# Patient Record
Sex: Male | Born: 1990 | Race: Black or African American | Hispanic: No | Marital: Single | Smoking: Current every day smoker
Health system: Southern US, Community
[De-identification: ages and names within clinical notes are randomized; demographics above are authoritative.]

## PROBLEM LIST (undated history)

## (undated) DIAGNOSIS — J189 Pneumonia, unspecified organism: Secondary | ICD-10-CM

## (undated) DIAGNOSIS — N200 Calculus of kidney: Secondary | ICD-10-CM

## (undated) DIAGNOSIS — I2699 Other pulmonary embolism without acute cor pulmonale: Secondary | ICD-10-CM

## (undated) HISTORY — DX: Other pulmonary embolism without acute cor pulmonale: I26.99

---
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
## 2012-03-13 ENCOUNTER — Emergency Department (HOSPITAL_COMMUNITY)
Admission: EM | Admit: 2012-03-13 | Discharge: 2012-03-13 | Disposition: A | Discharge status: Home / Self Care | Payer: No Typology Code available for payment source | Attending: Emergency Medicine | Admitting: Emergency Medicine

## 2012-03-13 ENCOUNTER — Encounter (HOSPITAL_COMMUNITY): Payer: Self-pay | Admitting: Family Medicine

## 2012-03-13 ENCOUNTER — Emergency Department (HOSPITAL_COMMUNITY): Payer: No Typology Code available for payment source

## 2012-03-13 DIAGNOSIS — Y998 Other external cause status: Secondary | ICD-10-CM | POA: Insufficient documentation

## 2012-03-13 DIAGNOSIS — F17200 Nicotine dependence, unspecified, uncomplicated: Secondary | ICD-10-CM | POA: Insufficient documentation

## 2012-03-13 DIAGNOSIS — Y93I9 Activity, other involving external motion: Secondary | ICD-10-CM | POA: Insufficient documentation

## 2012-03-13 DIAGNOSIS — S335XXA Sprain of ligaments of lumbar spine, initial encounter: Secondary | ICD-10-CM | POA: Insufficient documentation

## 2012-03-13 DIAGNOSIS — S39012A Strain of muscle, fascia and tendon of lower back, initial encounter: Secondary | ICD-10-CM

## 2012-03-13 LAB — URINALYSIS, ROUTINE W REFLEX MICROSCOPIC
Ketones, ur: NEGATIVE mg/dL
Nitrite: NEGATIVE
Specific Gravity, Urine: 1.021 (ref 1.005–1.030)
Urobilinogen, UA: 0.2 mg/dL (ref 0.0–1.0)
pH: 6 (ref 5.0–8.0)

## 2012-03-13 LAB — URINE MICROSCOPIC-ADD ON

## 2012-03-13 MED ORDER — IBUPROFEN 400 MG PO TABS
600.0000 mg | ORAL_TABLET | Freq: Once | ORAL | Status: AC
  Administered 2012-03-13: 600 mg via ORAL
  Filled 2012-03-13: qty 1
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
## 2012-03-13 NOTE — ED Notes (Signed)
MD at bedside. 
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
## 2013-05-05 ENCOUNTER — Emergency Department (HOSPITAL_COMMUNITY)
Admission: EM | Admit: 2013-05-05 | Discharge: 2013-05-05 | Disposition: A | Discharge status: Home / Self Care | Payer: Self-pay | Attending: Emergency Medicine | Admitting: Emergency Medicine

## 2013-05-05 ENCOUNTER — Emergency Department (HOSPITAL_COMMUNITY): Payer: PRIVATE HEALTH INSURANCE

## 2013-05-05 ENCOUNTER — Encounter (HOSPITAL_COMMUNITY): Payer: Self-pay | Admitting: Emergency Medicine

## 2013-05-05 DIAGNOSIS — F17200 Nicotine dependence, unspecified, uncomplicated: Secondary | ICD-10-CM | POA: Insufficient documentation

## 2013-05-05 DIAGNOSIS — Z79899 Other long term (current) drug therapy: Secondary | ICD-10-CM | POA: Insufficient documentation

## 2013-05-05 DIAGNOSIS — M5430 Sciatica, unspecified side: Secondary | ICD-10-CM | POA: Insufficient documentation

## 2013-05-05 DIAGNOSIS — IMO0002 Reserved for concepts with insufficient information to code with codable children: Secondary | ICD-10-CM | POA: Insufficient documentation

## 2013-05-05 DIAGNOSIS — M25559 Pain in unspecified hip: Secondary | ICD-10-CM | POA: Insufficient documentation

## 2013-05-05 DIAGNOSIS — M5432 Sciatica, left side: Secondary | ICD-10-CM

## 2013-05-05 DIAGNOSIS — M545 Low back pain: Secondary | ICD-10-CM

## 2013-05-05 MED ORDER — METHOCARBAMOL 500 MG PO TABS
750.0000 mg | ORAL_TABLET | Freq: Once | ORAL | Status: AC
  Administered 2013-05-05: 750 mg via ORAL
  Filled 2013-05-05: qty 2

## 2013-05-05 MED ORDER — PREDNISONE 20 MG PO TABS: 40.0000 mg | ORAL_TABLET | Freq: Every day | ORAL | Status: DC

## 2013-05-05 MED ORDER — METHOCARBAMOL 500 MG PO TABS: 500.0000 mg | ORAL_TABLET | Freq: Two times a day (BID) | ORAL | Status: DC
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
## 2013-05-05 NOTE — ED Notes (Signed)
Pt c/o back and posterior thigh pain x 1.5 months. Works as Engineer, materials, standing for long periods.

## 2013-05-06 NOTE — ED Provider Notes (Signed)
Medical screening examination/treatment/procedure(s) were performed by non-physician practitioner and as supervising physician I was immediately available for consultation/collaboration.  EKG Interpretation   None         Audree Camel, MD 05/06/13 [phone]

## 2018-09-13 ENCOUNTER — Inpatient Hospital Stay (HOSPITAL_COMMUNITY)
Admission: EM | Admit: 2018-09-13 | Discharge: 2018-09-17 | DRG: 175 | Disposition: A | Discharge status: Home Health | Payer: PRIVATE HEALTH INSURANCE | Attending: Internal Medicine | Admitting: Internal Medicine

## 2018-09-13 ENCOUNTER — Encounter (HOSPITAL_COMMUNITY): Payer: Self-pay

## 2018-09-13 DIAGNOSIS — Z87442 Personal history of urinary calculi: Secondary | ICD-10-CM

## 2018-09-13 DIAGNOSIS — R091 Pleurisy: Secondary | ICD-10-CM

## 2018-09-13 DIAGNOSIS — R109 Unspecified abdominal pain: Secondary | ICD-10-CM | POA: Diagnosis not present

## 2018-09-13 DIAGNOSIS — I82402 Acute embolism and thrombosis of unspecified deep veins of left lower extremity: Secondary | ICD-10-CM | POA: Diagnosis present

## 2018-09-13 DIAGNOSIS — I82461 Acute embolism and thrombosis of right calf muscular vein: Secondary | ICD-10-CM | POA: Diagnosis present

## 2018-09-13 DIAGNOSIS — F17200 Nicotine dependence, unspecified, uncomplicated: Secondary | ICD-10-CM | POA: Diagnosis present

## 2018-09-13 DIAGNOSIS — J181 Lobar pneumonia, unspecified organism: Secondary | ICD-10-CM | POA: Diagnosis present

## 2018-09-13 DIAGNOSIS — I2609 Other pulmonary embolism with acute cor pulmonale: Secondary | ICD-10-CM | POA: Diagnosis not present

## 2018-09-13 DIAGNOSIS — I2699 Other pulmonary embolism without acute cor pulmonale: Secondary | ICD-10-CM | POA: Diagnosis present

## 2018-09-13 DIAGNOSIS — J189 Pneumonia, unspecified organism: Secondary | ICD-10-CM

## 2018-09-13 DIAGNOSIS — N200 Calculus of kidney: Secondary | ICD-10-CM | POA: Diagnosis present

## 2018-09-13 DIAGNOSIS — Z23 Encounter for immunization: Secondary | ICD-10-CM

## 2018-09-13 HISTORY — DX: Calculus of kidney: N20.0

## 2018-09-13 HISTORY — DX: Pneumonia, unspecified organism: J18.9

## 2018-09-13 MED ORDER — KETOROLAC TROMETHAMINE 15 MG/ML IJ SOLN
15.0000 mg | Freq: Once | INTRAMUSCULAR | Status: AC
  Administered 2018-09-14: 15 mg via INTRAVENOUS
  Filled 2018-09-13: qty 1

## 2018-09-13 MED ORDER — SODIUM CHLORIDE 0.9 % IV BOLUS
1000.0000 mL | Freq: Once | INTRAVENOUS | Status: AC
  Administered 2018-09-14: 1000 mL via INTRAVENOUS

## 2018-09-13 NOTE — ED Triage Notes (Signed)
Pt states that he just got back from [place] and was diagnosed with L sided kidney stone and pneumonia. Pain is getting worse, pt has prescription but hasn't had it filled yet.

## 2018-09-13 NOTE — ED Provider Notes (Signed)
[facility] EMERGENCY DEPARTMENT Provider Note  CSN: [id number] Arrival date & time: 09/13/18 2135  Chief Complaint(s) Nephrolithiasis  HPI Johnny Ramsey is a 28 y.o. male with no pertinent past medical history who presents to the emergency department with left-sided flank pain that began several days ago.  Patient is a [employer] and just drove back from [place].  Patient reports that he was seen in a small emergency department in [place] and diagnosed with left renal stone seen on CT scan.  Incidentally he was told that they also saw evidence of pneumonia in the left lower lobe.  He was given prescription for Levaquin which he is has not filled.  Flank pain has been fluctuating in nature but gradually worsened since onset.  Now severe.  Initially improved with Motrin and Aleve but not anymore.  Pain is exacerbated with deep breathing.  He denies any recent fevers or infections.  No coughing or congestion.  No shortness of breath.  He does endorse pleuritic chest pain.  No lower extremity pain or swelling.  Denies any prior blood clots, history of autoimmune disorder, cancer.  Denies any drug use.  Endorses tobacco use.  He denies any nausea or vomiting.  No diarrhea.  He does report change in the urine color.  HPI  Past Medical History Past Medical History:  Diagnosis Date  . Kidney stone   . Pneumonia    There are no active problems to display for this patient.  Home Medication(s) Prior to Admission medications   Medication Sig Start Date End Date Taking? Authorizing Provider  methocarbamol (ROBAXIN) 500 MG tablet Take 1 tablet (500 mg total) by mouth 2 (two) times daily. Patient not taking: Reported on 09/13/2018 05/05/13   Muthersbaugh, Dahlia Client, PA-C  predniSONE (DELTASONE) 20 MG tablet Take 2 tablets (40 mg total) by mouth daily. Patient not taking: Reported on 09/13/2018 05/05/13   Muthersbaugh, Boyd Kerbs                                                                                   Past Surgical History History reviewed. No pertinent surgical history. Family History No family history on file.  Social History Social History   Tobacco Use  . Smoking status: Current Every Day Smoker  Substance Use Topics  . Alcohol use: No  . Drug use: Not on file   Allergies Patient has no known allergies.  Review of Systems Review of Systems All other systems are reviewed and are negative for acute change except as noted in the HPI  Physical Exam Vital Signs  I have reviewed the triage vital signs BP 128/85   Pulse (!) 104   Temp 100.1 F (37.8 C) (Oral)   Resp 16   SpO2 96%   Physical Exam Vitals signs reviewed.  Constitutional:      General: He is not in acute distress.    Appearance: He is well-developed. He is not diaphoretic.  HENT:     Head: Normocephalic and atraumatic.     Nose: Nose normal.  Eyes:     General: No scleral icterus.       Right eye: No discharge.  Left eye: No discharge.     Conjunctiva/sclera: Conjunctivae normal.     Pupils: Pupils are equal, round, and reactive to light.  Neck:     Musculoskeletal: Normal range of motion and neck supple.  Cardiovascular:     Rate and Rhythm: Normal rate and regular rhythm.     Heart sounds: No murmur. No friction rub. No gallop.   Pulmonary:     Effort: Pulmonary effort is normal. No respiratory distress.     Breath sounds: Normal breath sounds. No stridor. No rales.  Abdominal:     General: There is no distension.     Palpations: Abdomen is soft.     Tenderness: There is no abdominal tenderness. There is left CVA tenderness.  Musculoskeletal:     Thoracic back: He exhibits tenderness.       Back:  Skin:    General: Skin is warm and dry.     Findings: No erythema or rash.  Neurological:     Mental Status: He is alert and oriented to person, place, and time.     ED Results and Treatments Labs (all  labs ordered are listed, but only abnormal results are displayed) Labs Reviewed  CBC WITH DIFFERENTIAL/PLATELET - Abnormal; Notable for the following components:      Result Value   WBC 10.8 (*)    MCH 25.6 (*)    All other components within normal limits  D-DIMER, QUANTITATIVE (NOT AT [facility]) - Abnormal; Notable for the following components:   D-Dimer, Quant 3.21 (*)    All other components within normal limits  URINALYSIS, ROUTINE W REFLEX MICROSCOPIC - Abnormal; Notable for the following components:   Hgb urine dipstick MODERATE (*)    Ketones, ur 5 (*)    All other components within normal limits  BASIC METABOLIC PANEL                                                                                                                         EKG  EKG Interpretation  Date/Time:  Monday September 14 2018 00:36:53 EDT Ventricular Rate:  86 PR Interval:    QRS Duration: 118 QT Interval:  362 QTC Calculation: 433 R Axis:   30 Text Interpretation:  Sinus rhythm Incomplete right bundle branch block Borderline ST elevation, anterolateral leads Baseline wander in lead(s) V1 NO STEMI. No old tracing to compare Confirmed by Drema Pry [phone]) on 09/14/2018 2:34:27 AM      Radiology Dg Chest 2 View  Result Date: 09/14/2018 CLINICAL DATA:  Left-sided kidney stone and pneumonia diagnosis. Recent return from [place]. EXAM: CHEST - 2 VIEW COMPARISON:  None. FINDINGS: Shallow inspiration. Infiltration or atelectasis in both lung bases, greater on the left. Blunting of the left costophrenic angle consistent with fluid or pleural thickening. No pneumothorax. Heart size and pulmonary vascularity are normal. Mediastinal contours appear intact. IMPRESSION: Shallow inspiration with infiltration or atelectasis in both lung bases. Fluid or thickened pleura in the left costophrenic angle. Electronically  Signed   By: Burman Nieves M.D.   On: 09/14/2018 00:33   Ct Angio Chest Pe W And/or Wo Contrast  Result  Date: 09/14/2018 CLINICAL DATA:  Shortness of breath, chest and flank pain. Positive D-dimer. History of kidney stones. EXAM: CT ANGIOGRAPHY CHEST CT ABDOMEN AND PELVIS WITH CONTRAST TECHNIQUE: Multidetector CT imaging of the chest was performed using the standard protocol during bolus administration of intravenous contrast. Multiplanar CT image reconstructions and MIPs were obtained to evaluate the vascular anatomy. Multidetector CT imaging of the abdomen and pelvis was performed using the standard protocol during bolus administration of intravenous contrast. CONTRAST:  <See Chart> ISOVUE-370 IOPAMIDOL (ISOVUE-370) INJECTION 76% COMPARISON:  None. FINDINGS: CTA CHEST FINDINGS CARDIOVASCULAR: Adequate contrast opacification of the pulmonary artery's. Main pulmonary artery is not enlarged. Nonocclusive bilateral lower lobe segmental to subsegmental pulmonary emboli. Heart size is normal, RIGHT heart strain (RV/LV equals 1). No pericardial effusion. Thoracic aorta is normal course and caliber, unremarkable. MEDIASTINUM/NODES: No lymphadenopathy by CT size criteria. LUNGS/PLEURA: Tracheobronchial tree is patent, no pneumothorax. Patchy consolidation LEFT > RIGHT lung base with small LEFT pleural effusion. MUSCULOSKELETAL: Visualized soft tissues and included osseous structures appear normal. Review of the MIP images confirms the above findings. CT ABDOMEN and PELVIS FINDINGS HEPATOBILIARY: Liver and gallbladder are normal. PANCREAS: Normal. SPLEEN: Normal. ADRENALS/URINARY TRACT: Kidneys are orthotopic, demonstrating symmetric enhancement. No nephrolithiasis, hydronephrosis or solid renal masses. Early excretion of contrast decreases sensitivity for small nonobstructing calculi. The unopacified ureters are normal in course and caliber. Urinary bladder is partially distended and unremarkable. Normal adrenal glands. STOMACH/BOWEL: The stomach, small and large bowel are normal in course and caliber without inflammatory  changes. Normal appendix. VASCULAR/LYMPHATIC: Aortoiliac vessels are normal in course and caliber. No lymphadenopathy by CT size criteria. REPRODUCTIVE: Normal. OTHER: No intraperitoneal free fluid or free air. MUSCULOSKELETAL: Nonacute.  Moderate L4-5 disc osteophyte complex. IMPRESSION: CTA CHEST: 1. Acute bilateral lower lobe nonocclusive segmental to subsegmental pulmonary emboli. CT evidence of right heart strain (RV/LV Ratio = 1) consistent with at least submassive (intermediate risk) PE. The presence of right heart strain has been associated with an increased risk of morbidity and mortality. Please activate Code PE by paging [phone]. 2. LEFT lower lobe pneumonia, probable RIGHT lower lobe pneumonia. Bibasilar atelectasis. Small LEFT pleural effusion. CT ABDOMEN AND PELVIS: 1. No acute intra-abdominal/pelvic disease. Critical Value/emergent results were called by telephone at the time of interpretation on 09/14/2018 at 4:06 am to Dr. Drema Pry , who verbally acknowledged these results. Electronically Signed   By: Awilda Metro M.D.   On: 09/14/2018 04:07   Ct Abdomen Pelvis W Contrast  Result Date: 09/14/2018 CLINICAL DATA:  Shortness of breath, chest and flank pain. Positive D-dimer. History of kidney stones. EXAM: CT ANGIOGRAPHY CHEST CT ABDOMEN AND PELVIS WITH CONTRAST TECHNIQUE: Multidetector CT imaging of the chest was performed using the standard protocol during bolus administration of intravenous contrast. Multiplanar CT image reconstructions and MIPs were obtained to evaluate the vascular anatomy. Multidetector CT imaging of the abdomen and pelvis was performed using the standard protocol during bolus administration of intravenous contrast. CONTRAST:  <See Chart> ISOVUE-370 IOPAMIDOL (ISOVUE-370) INJECTION 76% COMPARISON:  None. FINDINGS: CTA CHEST FINDINGS CARDIOVASCULAR: Adequate contrast opacification of the pulmonary artery's. Main pulmonary artery is not enlarged. Nonocclusive  bilateral lower lobe segmental to subsegmental pulmonary emboli. Heart size is normal, RIGHT heart strain (RV/LV equals 1). No pericardial effusion. Thoracic aorta is normal course and caliber, unremarkable. MEDIASTINUM/NODES: No lymphadenopathy by CT size  criteria. LUNGS/PLEURA: Tracheobronchial tree is patent, no pneumothorax. Patchy consolidation LEFT > RIGHT lung base with small LEFT pleural effusion. MUSCULOSKELETAL: Visualized soft tissues and included osseous structures appear normal. Review of the MIP images confirms the above findings. CT ABDOMEN and PELVIS FINDINGS HEPATOBILIARY: Liver and gallbladder are normal. PANCREAS: Normal. SPLEEN: Normal. ADRENALS/URINARY TRACT: Kidneys are orthotopic, demonstrating symmetric enhancement. No nephrolithiasis, hydronephrosis or solid renal masses. Early excretion of contrast decreases sensitivity for small nonobstructing calculi. The unopacified ureters are normal in course and caliber. Urinary bladder is partially distended and unremarkable. Normal adrenal glands. STOMACH/BOWEL: The stomach, small and large bowel are normal in course and caliber without inflammatory changes. Normal appendix. VASCULAR/LYMPHATIC: Aortoiliac vessels are normal in course and caliber. No lymphadenopathy by CT size criteria. REPRODUCTIVE: Normal. OTHER: No intraperitoneal free fluid or free air. MUSCULOSKELETAL: Nonacute.  Moderate L4-5 disc osteophyte complex. IMPRESSION: CTA CHEST: 1. Acute bilateral lower lobe nonocclusive segmental to subsegmental pulmonary emboli. CT evidence of right heart strain (RV/LV Ratio = 1) consistent with at least submassive (intermediate risk) PE. The presence of right heart strain has been associated with an increased risk of morbidity and mortality. Please activate Code PE by paging [phone]. 2. LEFT lower lobe pneumonia, probable RIGHT lower lobe pneumonia. Bibasilar atelectasis. Small LEFT pleural effusion. CT ABDOMEN AND PELVIS: 1. No acute  intra-abdominal/pelvic disease. Critical Value/emergent results were called by telephone at the time of interpretation on 09/14/2018 at 4:06 am to Dr. Drema Pry , who verbally acknowledged these results. Electronically Signed   By: Awilda Metro M.D.   On: 09/14/2018 04:07   Pertinent labs & imaging results that were available during my care of the patient were reviewed by me and considered in my medical decision making (see chart for details).  Medications Ordered in ED Medications  cefTRIAXone (ROCEPHIN) 1 g in sodium chloride 0.9 % 100 mL IVPB (has no administration in time range)  azithromycin (ZITHROMAX) 500 mg in sodium chloride 0.9 % 250 mL IVPB (has no administration in time range)  ketorolac (TORADOL) 15 MG/ML injection 15 mg (15 mg Intravenous Given 09/14/18 0034)  sodium chloride 0.9 % bolus 1,000 mL (1,000 mLs Intravenous New Bag/Given 09/14/18 0033)  morphine 4 MG/ML injection 4 mg (4 mg Intravenous Given 09/14/18 0109)  HYDROmorphone (DILAUDID) injection 1 mg (1 mg Intravenous Given 09/14/18 0151)  diazepam (VALIUM) injection 2.5 mg (2.5 mg Intravenous Given 09/14/18 0232)  iopamidol (ISOVUE-370) 76 % injection 100 mL (100 mLs Intravenous Contrast Given 09/14/18 0329)                                                                                                                                    Procedures Procedures CRITICAL CARE Performed by: Amadeo Garnet Fernanda Twaddell Total critical care time: 55 minutes Critical care time was exclusive of separately billable procedures and treating other patients. Critical care was necessary to treat or prevent imminent or life-threatening deterioration. Critical  care was time spent personally by me on the following activities: development of treatment plan with patient and/or surrogate as well as nursing, discussions with consultants, evaluation of patient's response to treatment, examination of patient, obtaining history from patient or surrogate,  ordering and performing treatments and interventions, ordering and review of laboratory studies, ordering and review of radiographic studies, pulse oximetry and re-evaluation of patient's condition.   (including critical care time)  Medical Decision Making / ED Course I have reviewed the nursing notes for this encounter and the patient's prior records (if available in EHR or on provided paperwork).    Patient presents with left flank pain.  Noted to have low-grade fever.  Denies any respiratory symptoms, but diagnosed with pneumonia on CT scan earlier this week.  Has not taken his antibiotics yet.  Also diagnosed with left renal stone seen on CT scan.  I have concern for possible PE given her history of long distance driving.  Dimer obtained and positive.  CT a notable for bilateral segmental and subsegmental nonobstructing PEs with evidence of right heart strain.  Also showing evidence of pneumonia.  UA with hematuria without evidence of infection.  CT abdomen also obtained and ruled out psoas abscess.  No intra-abdominal inflammatory/infectious process.  We will treat pneumonia with Rocephin and Cipro.  Start patient on IV heparin.  Will admit to medicine for further evaluation with right heart strain.  Final Clinical Impression(s) / ED Diagnoses Final diagnoses:  Pleurisy  Other acute pulmonary embolism with acute cor pulmonale (HCC)  Community acquired pneumonia of left lower lobe of lung (HCC)      This chart was dictated using voice recognition software.  Despite best efforts to proofread,  errors can occur which can change the documentation meaning.   Nira Conn, MD 09/14/18 [phone]

## 2018-09-14 ENCOUNTER — Emergency Department (HOSPITAL_COMMUNITY): Payer: PRIVATE HEALTH INSURANCE

## 2018-09-14 ENCOUNTER — Other Ambulatory Visit: Payer: Self-pay

## 2018-09-14 ENCOUNTER — Inpatient Hospital Stay (HOSPITAL_COMMUNITY): Payer: PRIVATE HEALTH INSURANCE

## 2018-09-14 ENCOUNTER — Encounter (HOSPITAL_COMMUNITY): Payer: Self-pay

## 2018-09-14 DIAGNOSIS — I82401 Acute embolism and thrombosis of unspecified deep veins of right lower extremity: Secondary | ICD-10-CM | POA: Diagnosis not present

## 2018-09-14 DIAGNOSIS — J181 Lobar pneumonia, unspecified organism: Secondary | ICD-10-CM | POA: Diagnosis not present

## 2018-09-14 DIAGNOSIS — R0781 Pleurodynia: Secondary | ICD-10-CM | POA: Diagnosis not present

## 2018-09-14 DIAGNOSIS — I82461 Acute embolism and thrombosis of right calf muscular vein: Secondary | ICD-10-CM | POA: Diagnosis present

## 2018-09-14 DIAGNOSIS — I82402 Acute embolism and thrombosis of unspecified deep veins of left lower extremity: Secondary | ICD-10-CM | POA: Diagnosis present

## 2018-09-14 DIAGNOSIS — N200 Calculus of kidney: Secondary | ICD-10-CM | POA: Diagnosis present

## 2018-09-14 DIAGNOSIS — I2699 Other pulmonary embolism without acute cor pulmonale: Secondary | ICD-10-CM

## 2018-09-14 DIAGNOSIS — F17200 Nicotine dependence, unspecified, uncomplicated: Secondary | ICD-10-CM | POA: Diagnosis present

## 2018-09-14 DIAGNOSIS — I2609 Other pulmonary embolism with acute cor pulmonale: Secondary | ICD-10-CM | POA: Diagnosis present

## 2018-09-14 DIAGNOSIS — Z23 Encounter for immunization: Secondary | ICD-10-CM | POA: Diagnosis not present

## 2018-09-14 DIAGNOSIS — Z87442 Personal history of urinary calculi: Secondary | ICD-10-CM | POA: Diagnosis not present

## 2018-09-14 DIAGNOSIS — R109 Unspecified abdominal pain: Secondary | ICD-10-CM | POA: Diagnosis present

## 2018-09-14 DIAGNOSIS — J189 Pneumonia, unspecified organism: Secondary | ICD-10-CM | POA: Diagnosis present

## 2018-09-14 LAB — CBC WITH DIFFERENTIAL/PLATELET
Abs Immature Granulocytes: 0.03 10*3/uL (ref 0.00–0.07)
BASOS ABS: 0 10*3/uL (ref 0.0–0.1)
Basophils Relative: 0 %
Eosinophils Absolute: 0.1 10*3/uL (ref 0.0–0.5)
Eosinophils Relative: 1 %
HEMATOCRIT: 42.8 % (ref 39.0–52.0)
HEMOGLOBIN: 13.3 g/dL (ref 13.0–17.0)
IMMATURE GRANULOCYTES: 0 %
LYMPHS ABS: 2.4 10*3/uL (ref 0.7–4.0)
LYMPHS PCT: 23 %
MCH: 25.6 pg — ABNORMAL LOW (ref 26.0–34.0)
MCHC: 31.1 g/dL (ref 30.0–36.0)
MCV: 82.5 fL (ref 80.0–100.0)
Monocytes Absolute: 1 10*3/uL (ref 0.1–1.0)
Monocytes Relative: 10 %
NEUTROS ABS: 7.1 10*3/uL (ref 1.7–7.7)
NEUTROS PCT: 66 %
NRBC: 0 % (ref 0.0–0.2)
Platelets: 278 10*3/uL (ref 150–400)
RBC: 5.19 MIL/uL (ref 4.22–5.81)
RDW: 13.4 % (ref 11.5–15.5)
WBC: 10.8 10*3/uL — ABNORMAL HIGH (ref 4.0–10.5)

## 2018-09-14 LAB — BASIC METABOLIC PANEL
ANION GAP: 11 (ref 5–15)
BUN: 10 mg/dL (ref 6–20)
CALCIUM: 9.6 mg/dL (ref 8.9–10.3)
CHLORIDE: 102 mmol/L (ref 98–111)
CO2: 25 mmol/L (ref 22–32)
Creatinine, Ser: 1.16 mg/dL (ref 0.61–1.24)
GFR calc non Af Amer: >60 mL/min (ref >60)
Glucose, Bld: 94 mg/dL (ref 70–99)
Potassium: 3.9 mmol/L (ref 3.5–5.1)
SODIUM: 138 mmol/L (ref 135–145)

## 2018-09-14 LAB — URINALYSIS, ROUTINE W REFLEX MICROSCOPIC
Bacteria, UA: NONE SEEN
Bilirubin Urine: NEGATIVE
GLUCOSE, UA: NEGATIVE mg/dL
Ketones, ur: 5 mg/dL — AB
Leukocytes,Ua: NEGATIVE
NITRITE: NEGATIVE
PROTEIN: NEGATIVE mg/dL
Specific Gravity, Urine: 1.024 (ref 1.005–1.030)
pH: 5 (ref 5.0–8.0)

## 2018-09-14 LAB — HIV ANTIBODY (ROUTINE TESTING W REFLEX): HIV Screen 4th Generation wRfx: NONREACTIVE

## 2018-09-14 LAB — ECHOCARDIOGRAM COMPLETE
Height: 72 in
Weight: 3760 oz

## 2018-09-14 LAB — STREP PNEUMONIAE URINARY ANTIGEN: Strep Pneumo Urinary Antigen: NEGATIVE

## 2018-09-14 LAB — INFLUENZA PANEL BY PCR (TYPE A & B)
Influenza A By PCR: NEGATIVE
Influenza B By PCR: NEGATIVE

## 2018-09-14 LAB — HEPARIN LEVEL (UNFRACTIONATED)
Heparin Unfractionated: 0.33 IU/mL (ref 0.30–0.70)
Heparin Unfractionated: 0.31 IU/mL (ref 0.30–0.70)

## 2018-09-14 LAB — D-DIMER, QUANTITATIVE (NOT AT ARMC): D DIMER QUANT: 3.21 ug{FEU}/mL — AB (ref 0.00–0.50)

## 2018-09-14 MED ORDER — ACETAMINOPHEN 325 MG PO TABS
650.0000 mg | ORAL_TABLET | Freq: Four times a day (QID) | ORAL | Status: DC | PRN
  Administered 2018-09-14: 650 mg via ORAL
  Filled 2018-09-14: qty 2

## 2018-09-14 MED ORDER — MORPHINE SULFATE (PF) 4 MG/ML IV SOLN
4.0000 mg | Freq: Once | INTRAVENOUS | Status: AC
  Administered 2018-09-14: 4 mg via INTRAVENOUS
  Filled 2018-09-14: qty 1

## 2018-09-14 MED ORDER — SODIUM CHLORIDE 0.9 % IV SOLN
500.0000 mg | Freq: Once | INTRAVENOUS | Status: AC
  Administered 2018-09-14: 500 mg via INTRAVENOUS
  Filled 2018-09-14: qty 500

## 2018-09-14 MED ORDER — DIAZEPAM 5 MG/ML IJ SOLN
2.5000 mg | Freq: Once | INTRAMUSCULAR | Status: AC
  Administered 2018-09-14: 2.5 mg via INTRAVENOUS
  Filled 2018-09-14: qty 2

## 2018-09-14 MED ORDER — SODIUM CHLORIDE 0.9 % IV SOLN
1.0000 g | Freq: Once | INTRAVENOUS | Status: AC
  Administered 2018-09-14: 1 g via INTRAVENOUS
  Filled 2018-09-14: qty 10

## 2018-09-14 MED ORDER — IOPAMIDOL (ISOVUE-370) INJECTION 76%
INTRAVENOUS | Status: AC
  Filled 2018-09-14: qty 100

## 2018-09-14 MED ORDER — HYDROMORPHONE HCL 1 MG/ML IJ SOLN
1.0000 mg | Freq: Once | INTRAMUSCULAR | Status: AC
  Administered 2018-09-14: 1 mg via INTRAVENOUS
  Filled 2018-09-14: qty 1

## 2018-09-14 MED ORDER — AZITHROMYCIN 250 MG PO TABS
500.0000 mg | ORAL_TABLET | ORAL | Status: DC
  Administered 2018-09-15 – 2018-09-17 (×3): 500 mg via ORAL
  Filled 2018-09-14 (×3): qty 2

## 2018-09-14 MED ORDER — IOPAMIDOL (ISOVUE-370) INJECTION 76%
100.0000 mL | Freq: Once | INTRAVENOUS | Status: AC | PRN
  Administered 2018-09-14: 100 mL via INTRAVENOUS

## 2018-09-14 MED ORDER — HEPARIN BOLUS VIA INFUSION
6000.0000 [IU] | Freq: Once | INTRAVENOUS | Status: AC
  Administered 2018-09-14: 6000 [IU] via INTRAVENOUS
  Filled 2018-09-14: qty 6000

## 2018-09-14 MED ORDER — PNEUMOCOCCAL VAC POLYVALENT 25 MCG/0.5ML IJ INJ
0.5000 mL | INJECTION | INTRAMUSCULAR | Status: AC
  Administered 2018-09-16: 0.5 mL via INTRAMUSCULAR
  Filled 2018-09-14: qty 0.5

## 2018-09-14 MED ORDER — SODIUM CHLORIDE 0.9 % IV SOLN
1.0000 g | INTRAVENOUS | Status: DC
  Administered 2018-09-15 – 2018-09-17 (×3): 1 g via INTRAVENOUS
  Filled 2018-09-14 (×3): qty 10

## 2018-09-14 MED ORDER — HEPARIN (PORCINE) 25000 UT/250ML-% IV SOLN
2000.0000 [IU]/h | INTRAVENOUS | Status: DC
  Administered 2018-09-14: 1800 [IU]/h via INTRAVENOUS
  Administered 2018-09-15 – 2018-09-17 (×5): 2000 [IU]/h via INTRAVENOUS
  Filled 2018-09-14 (×7): qty 250

## 2018-09-14 MED ORDER — TRAMADOL HCL 50 MG PO TABS
50.0000 mg | ORAL_TABLET | Freq: Four times a day (QID) | ORAL | Status: DC | PRN
  Administered 2018-09-14 – 2018-09-16 (×3): 50 mg via ORAL
  Filled 2018-09-14 (×3): qty 1

## 2018-09-14 NOTE — ED Notes (Signed)
Pt returned from vascular

## 2018-09-14 NOTE — Progress Notes (Signed)
  Echocardiogram 2D Echocardiogram has been performed.  Johnny Ramsey L Androw 09/14/2018, 10:55 AM 

## 2018-09-14 NOTE — ED Notes (Signed)
Meal tray at bedside.  

## 2018-09-14 NOTE — Progress Notes (Signed)
Patient complaining of 4/10 intermittent, throbbing left lower flank pain.  Patient tried Tylenol earlier and was not really effective.  RN text paged [facility] with this information.

## 2018-09-14 NOTE — H&P (Signed)
History and Physical    Johnny Ramsey Hunn ZOX:[id number] DOB: 11/06/1990 DOA: 09/13/2018  PCP: Patient, No Pcp Per  Patient coming from: Home  I have personally briefly reviewed [facility]  Chief Complaint: Flank pain  HPI: Johnny Ramsey Myron is Ramsey 28 y.o. male with medical history significant of previously healthy.  Patient is Ramsey Naval architecttruck driver.  Just drove back from [place].  Was seen in small ED in [place] for L flank pain.  Diagnosed with small renal stone, and told that they incidentally saw evidence of LLL PNA on the CT scan.  Given script for levoquin which he didn't fill.  Flank pain has been fluctuating in nature, but gradually worsened since onset.  He continues to have no cough, no congestion, no SOB.  Pain is worse with deep breathing.  No PMH of blood clots, no lower extremity swelling or pain.   ED Course: D. Dimer was elevated at 3.2.  UA shows mod HGB, neg LE, nitrites, or WBCs.  Tm 100.9.  HR 109. WBC 10.8k  CTA chest reveals B PEs with R heart strain.  LLL PNA, and probable RLL PNA.  Started on heparin gtt, rocephin, and azithro.   Review of Systems: As per HPI otherwise 10 point review of systems negative.   Past Medical History:  Diagnosis Date  . Kidney stone   . Pneumonia     History reviewed. No pertinent surgical history.   reports that he has been smoking. He does not have any smokeless tobacco history on file. He reports that he does not drink alcohol. No history on file for drug.  No Known Allergies  No family history on file. No sick contacts in family  Prior to Admission medications   Medication Sig Start Date End Date Taking? Authorizing Provider  methocarbamol (ROBAXIN) 500 MG tablet Take 1 tablet (500 mg total) by mouth 2 (two) times daily. Patient not taking: Reported on 09/13/2018 05/05/13   Muthersbaugh, Dahlia ClientHannah, PA-C  predniSONE (DELTASONE) 20 MG tablet Take 2 tablets (40 mg total) by mouth  daily. Patient not taking: Reported on 09/13/2018 05/05/13   Muthersbaugh, Dahlia ClientHannah, PA-C    Physical Exam: Vitals:   09/14/18 0111 09/14/18 0130 09/14/18 0415 09/14/18 0438  BP:  133/86  111/68  Pulse:  82 91 78  Resp:  (!) 26 (!) 23 16  Temp:      TempSrc:      SpO2:  100% 93% 94%  Weight: 106.6 kg     Height: 6' (1.829 m)       Constitutional: NAD, calm, comfortable Eyes: PERRL, lids and conjunctivae normal ENMT: Mucous membranes are moist. Posterior pharynx clear of any exudate or lesions.Normal dentition.  Neck: normal, supple, no masses, no thyromegaly Respiratory: clear to auscultation bilaterally, no wheezing, no crackles. Normal respiratory effort. No accessory muscle use.  Cardiovascular: Regular rate and rhythm, no murmurs / rubs / gallops. No extremity edema. 2+ pedal pulses. No carotid bruits.  Abdomen: no tenderness, no masses palpated. No hepatosplenomegaly. Bowel sounds positive.  Musculoskeletal: no clubbing / cyanosis. No joint deformity upper and lower extremities. Good ROM, no contractures. Normal muscle tone.  Skin: no rashes, lesions, ulcers. No induration Neurologic: CN 2-12 grossly intact. Sensation intact, DTR normal. Strength 5/5 in all 4.  Psychiatric: Normal judgment and insight. Alert and oriented x 3. Normal mood.    Labs on Admission: I have personally reviewed following labs and imaging studies  CBC: Recent Labs  Lab 09/14/18 0002  WBC 10.8*  NEUTROABS 7.1  HGB 13.3  HCT 42.8  MCV 82.5  PLT 278   Basic Metabolic Panel: Recent Labs  Lab 09/14/18 0002  NA 138  K 3.9  CL 102  CO2 25  GLUCOSE 94  BUN 10  CREATININE 1.16  CALCIUM 9.6   GFR: Estimated Creatinine Clearance: 121.8 mL/min (by C-G formula based on SCr of 1.16 mg/dL). Liver Function Tests: No results for input(s): AST, ALT, ALKPHOS, BILITOT, PROT, ALBUMIN in the last 168 hours. No results for input(s): LIPASE, AMYLASE in the last 168 hours. No results for input(s): AMMONIA  in the last 168 hours. Coagulation Profile: No results for input(s): INR, PROTIME in the last 168 hours. Cardiac Enzymes: No results for input(s): CKTOTAL, CKMB, CKMBINDEX, TROPONINI in the last 168 hours. BNP (last 3 results) No results for input(s): PROBNP in the last 8760 hours. HbA1C: No results for input(s): HGBA1C in the last 72 hours. CBG: No results for input(s): GLUCAP in the last 168 hours. Lipid Profile: No results for input(s): CHOL, HDL, LDLCALC, TRIG, CHOLHDL, LDLDIRECT in the last 72 hours. Thyroid Function Tests: No results for input(s): TSH, T4TOTAL, FREET4, T3FREE, THYROIDAB in the last 72 hours. Anemia Panel: No results for input(s): VITAMINB12, FOLATE, FERRITIN, TIBC, IRON, RETICCTPCT in the last 72 hours. Urine analysis:    Component Value Date/Time   COLORURINE YELLOW 09/14/2018 0034   APPEARANCEUR CLEAR 09/14/2018 0034   LABSPEC 1.024 09/14/2018 0034   PHURINE 5.0 09/14/2018 0034   GLUCOSEU NEGATIVE 09/14/2018 0034   HGBUR MODERATE ([place]) 09/14/2018 0034   BILIRUBINUR NEGATIVE 09/14/2018 0034   KETONESUR 5 ([place]) 09/14/2018 0034   PROTEINUR NEGATIVE 09/14/2018 0034   UROBILINOGEN 0.2 03/13/2012 1400   NITRITE NEGATIVE 09/14/2018 0034   LEUKOCYTESUR NEGATIVE 09/14/2018 0034    Radiological Exams on Admission: Dg Chest 2 View  Result Date: 09/14/2018 CLINICAL DATA:  Left-sided kidney stone and pneumonia diagnosis. Recent return from [place]. EXAM: CHEST - 2 VIEW COMPARISON:  None. FINDINGS: Shallow inspiration. Infiltration or atelectasis in both lung bases, greater on the left. Blunting of the left costophrenic angle consistent with fluid or pleural thickening. No pneumothorax. Heart size and pulmonary vascularity are normal. Mediastinal contours appear intact. IMPRESSION: Shallow inspiration with infiltration or atelectasis in both lung bases. Fluid or thickened pleura in the left costophrenic angle. Electronically Signed   By: Burman Nieves M.D.   On:  09/14/2018 00:33   Ct Angio Chest Pe W And/or Wo Contrast  Result Date: 09/14/2018 CLINICAL DATA:  Shortness of breath, chest and flank pain. Positive D-dimer. History of kidney stones. EXAM: CT ANGIOGRAPHY CHEST CT ABDOMEN AND PELVIS WITH CONTRAST TECHNIQUE: Multidetector CT imaging of the chest was performed using the standard protocol during bolus administration of intravenous contrast. Multiplanar CT image reconstructions and MIPs were obtained to evaluate the vascular anatomy. Multidetector CT imaging of the abdomen and pelvis was performed using the standard protocol during bolus administration of intravenous contrast. CONTRAST:  <See Chart> ISOVUE-370 IOPAMIDOL (ISOVUE-370) INJECTION 76% COMPARISON:  None. FINDINGS: CTA CHEST FINDINGS CARDIOVASCULAR: Adequate contrast opacification of the pulmonary artery's. Main pulmonary artery is not enlarged. Nonocclusive bilateral lower lobe segmental to subsegmental pulmonary emboli. Heart size is normal, RIGHT heart strain (RV/LV equals 1). No pericardial effusion. Thoracic aorta is normal course and caliber, unremarkable. MEDIASTINUM/NODES: No lymphadenopathy by CT size criteria. LUNGS/PLEURA: Tracheobronchial tree is patent, no pneumothorax. Patchy consolidation LEFT > RIGHT lung base with small LEFT pleural effusion. MUSCULOSKELETAL: Visualized  soft tissues and included osseous structures appear normal. Review of the MIP images confirms the above findings. CT ABDOMEN and PELVIS FINDINGS HEPATOBILIARY: Liver and gallbladder are normal. PANCREAS: Normal. SPLEEN: Normal. ADRENALS/URINARY TRACT: Kidneys are orthotopic, demonstrating symmetric enhancement. No nephrolithiasis, hydronephrosis or solid renal masses. Early excretion of contrast decreases sensitivity for small nonobstructing calculi. The unopacified ureters are normal in course and caliber. Urinary bladder is partially distended and unremarkable. Normal adrenal glands. STOMACH/BOWEL: The stomach, small and  large bowel are normal in course and caliber without inflammatory changes. Normal appendix. VASCULAR/LYMPHATIC: Aortoiliac vessels are normal in course and caliber. No lymphadenopathy by CT size criteria. REPRODUCTIVE: Normal. OTHER: No intraperitoneal free fluid or free air. MUSCULOSKELETAL: Nonacute.  Moderate L4-5 disc osteophyte complex. IMPRESSION: CTA CHEST: 1. Acute bilateral lower lobe nonocclusive segmental to subsegmental pulmonary emboli. CT evidence of right heart strain (RV/LV Ratio = 1) consistent with at least submassive (intermediate risk) PE. The presence of right heart strain has been associated with an increased risk of morbidity and mortality. Please activate Code PE by paging [phone]. 2. LEFT lower lobe pneumonia, probable RIGHT lower lobe pneumonia. Bibasilar atelectasis. Small LEFT pleural effusion. CT ABDOMEN AND PELVIS: 1. No acute intra-abdominal/pelvic disease. Critical Value/emergent results were called by telephone at the time of interpretation on 09/14/2018 at 4:06 am to Dr. Drema Pry , who verbally acknowledged these results. Electronically Signed   By: Awilda Metro M.D.   On: 09/14/2018 04:07   Ct Abdomen Pelvis W Contrast  Result Date: 09/14/2018 CLINICAL DATA:  Shortness of breath, chest and flank pain. Positive D-dimer. History of kidney stones. EXAM: CT ANGIOGRAPHY CHEST CT ABDOMEN AND PELVIS WITH CONTRAST TECHNIQUE: Multidetector CT imaging of the chest was performed using the standard protocol during bolus administration of intravenous contrast. Multiplanar CT image reconstructions and MIPs were obtained to evaluate the vascular anatomy. Multidetector CT imaging of the abdomen and pelvis was performed using the standard protocol during bolus administration of intravenous contrast. CONTRAST:  <See Chart> ISOVUE-370 IOPAMIDOL (ISOVUE-370) INJECTION 76% COMPARISON:  None. FINDINGS: CTA CHEST FINDINGS CARDIOVASCULAR: Adequate contrast opacification of the pulmonary  artery's. Main pulmonary artery is not enlarged. Nonocclusive bilateral lower lobe segmental to subsegmental pulmonary emboli. Heart size is normal, RIGHT heart strain (RV/LV equals 1). No pericardial effusion. Thoracic aorta is normal course and caliber, unremarkable. MEDIASTINUM/NODES: No lymphadenopathy by CT size criteria. LUNGS/PLEURA: Tracheobronchial tree is patent, no pneumothorax. Patchy consolidation LEFT > RIGHT lung base with small LEFT pleural effusion. MUSCULOSKELETAL: Visualized soft tissues and included osseous structures appear normal. Review of the MIP images confirms the above findings. CT ABDOMEN and PELVIS FINDINGS HEPATOBILIARY: Liver and gallbladder are normal. PANCREAS: Normal. SPLEEN: Normal. ADRENALS/URINARY TRACT: Kidneys are orthotopic, demonstrating symmetric enhancement. No nephrolithiasis, hydronephrosis or solid renal masses. Early excretion of contrast decreases sensitivity for small nonobstructing calculi. The unopacified ureters are normal in course and caliber. Urinary bladder is partially distended and unremarkable. Normal adrenal glands. STOMACH/BOWEL: The stomach, small and large bowel are normal in course and caliber without inflammatory changes. Normal appendix. VASCULAR/LYMPHATIC: Aortoiliac vessels are normal in course and caliber. No lymphadenopathy by CT size criteria. REPRODUCTIVE: Normal. OTHER: No intraperitoneal free fluid or free air. MUSCULOSKELETAL: Nonacute.  Moderate L4-5 disc osteophyte complex. IMPRESSION: CTA CHEST: 1. Acute bilateral lower lobe nonocclusive segmental to subsegmental pulmonary emboli. CT evidence of right heart strain (RV/LV Ratio = 1) consistent with at least submassive (intermediate risk) PE. The presence of right heart strain has been associated with an increased risk  of morbidity and mortality. Please activate Code PE by paging [phone]. 2. LEFT lower lobe pneumonia, probable RIGHT lower lobe pneumonia. Bibasilar atelectasis. Small  LEFT pleural effusion. CT ABDOMEN AND PELVIS: 1. No acute intra-abdominal/pelvic disease. Critical Value/emergent results were called by telephone at the time of interpretation on 09/14/2018 at 4:06 am to Dr. Drema Pry , who verbally acknowledged these results. Electronically Signed   By: Awilda Metro M.D.   On: 09/14/2018 04:07    EKG: Independently reviewed.  Assessment/Plan Principal Problem:   Bilateral pulmonary embolism (HCC) Active Problems:   Community acquired pneumonia of both lower lobes (HCC)    1. B PEs - 1. Provoked, he is Ramsey truck driver, prolonged sitting behind wheel 2. Heparin gtt 3. 2d echo 4. BLE [place] 5. Tele monitor 2. CAP of BLL - 1. Does have fever 2. But absent most of the other expected symptoms of PNA (no cough, no SOB, etc). 3. Will treat with rocephin and azithro 4. BCx pending 5. Sputum Cx 6. Influenza PCR (though he really doesn't have ILI). 7. Covid-19 seems unlikely (no cough, no SOB, no URI symptoms, only seems to be affecting lower lobes of lungs, etc).  DVT prophylaxis: Heparin GTT Code Status: Full Family Communication: Family at bedside Disposition Plan: Home after admit Consults called: None Admission status: Admit to inpatient  Severity of Illness: The appropriate patient status for this patient is INPATIENT. Inpatient status is judged to be reasonable and necessary in order to provide the required intensity of service to ensure the patient's safety. The patient's presenting symptoms, physical exam findings, and initial radiographic and laboratory data in the context of their chronic comorbidities is felt to place them at high risk for further clinical deterioration. Furthermore, it is not anticipated that the patient will be medically stable for discharge from the hospital within 2 midnights of admission. The following factors support the patient status of inpatient.   " The patient's presenting symptoms include Flank pain, pleuritic  pain. " The worrisome physical exam findings include Fever 100.9, mild tachycardia. " The initial radiographic and laboratory data are worrisome because of B PE and BLL PNA both.    * I certify that at the point of admission it is my clinical judgment that the patient will require inpatient hospital care spanning beyond 2 midnights from the point of admission due to high intensity of service, high risk for further deterioration and high frequency of surveillance required.*    GARDNER, JARED M. DO [facility]  How to contact the [facility] Attending or Consulting provider 7A - 7P or covering provider during after hours 7P -7A, for this patient?  1. Check the care team in [facility] and look for [facility]) attending/consulting [facility] provider listed and b) the [facility] team listed 2. Log into [email or web address]  Amion Physician Scheduling and messaging for groups and whole hospitals  On call and physician scheduling software for group practices, residents, hospitalists and other medical providers for call, clinic, rotation and shift schedules. [employer] is [facility] hospital-wide system for scheduling doctors and paging doctors on call. EasyPlot is for scientific plotting and data analysis.  [email or web address]  and use [facility] universal password to access. If you do not have the password, please contact the hospital operator.  3. Locate the [facility] provider you are looking for under [facility] and page to [facility] number that you can be directly reached. 4. If you still have difficulty reaching the provider, please page the [facility] (Director on Call) for  the Hospitalists listed on amion for assistance.  09/14/2018, 5:11 AM

## 2018-09-14 NOTE — Progress Notes (Signed)
ANTICOAGULATION CONSULT NOTE  Pharmacy Consult for heparin Indication: pulmonary embolus  No Known Allergies  Patient Measurements: Height: 6' (182.9 cm) Weight: 235 lb (106.6 kg) IBW/kg (Calculated) : 77.6 Heparin Dosing Weight: 100kg  Vital Signs: BP: 132/85 (03/09 1325) Pulse Rate: 88 (03/09 1325)  Labs: Recent Labs    09/14/18 0002 09/14/18 1329  HGB 13.3  --   HCT 42.8  --   PLT 278  --   HEPARINUNFRC  --  0.33  CREATININE 1.16  --     Estimated Creatinine Clearance: 121.8 mL/min (by C-G formula based on SCr of 1.16 mg/dL).   Medical History: Past Medical History:  Diagnosis Date  . Kidney stone   . 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  Initial heparin level therapeutic at 0.33 but near bottom of range. No bleed or issues with infusion per RN. CBC wnl.  Goal of Therapy:  Heparin level 0.3-0.7 units/ml Monitor platelets by anticoagulation protocol: Yes   Plan:  Increase heparin to 1900 units/hr to ensure stays in range for PE 6h heparin level to confirm Monitor daily heparin level and CBC, s/sx bleeding  Babs Bertin, PharmD, BCPS Please check AMION for all [facility] contact numbers Clinical Pharmacist 09/14/2018 2:24 PM

## 2018-09-14 NOTE — ED Notes (Signed)
Patient transported to CT 

## 2018-09-14 NOTE — Progress Notes (Signed)
Lower extremity venous has been completed.   Preliminary results in CV Proc.   Blanch Media 09/14/2018 10:38 AM

## 2018-09-14 NOTE — ED Notes (Signed)
RN confirmed with Charge, RN pt may come to hall with mask on.

## 2018-09-14 NOTE — ED Notes (Signed)
Heparin verified with Maggie RN 

## 2018-09-14 NOTE — Progress Notes (Signed)
PROGRESS NOTE    Johnny Ramsey  ZOX:[id number] DOB: 20-Mar-1991 DOA: 09/13/2018 PCP: Patient, No Pcp Per    Brief Narrative:  Johnny Ramsey is a 28 y.o. male with medical history significant of previously healthy, who is a Naval architect,  drove back from [place].  Was seen in small ED in [place] for L flank pain.  Diagnosed with small renal stone, and told that they incidentally saw evidence of LLL PNA on the CT scan.  Given script for levoquin which he didn't fill.  Flank pain has been fluctuating in nature, but gradually worsened since onset. He continues to have no cough, no congestion, no SOB.  Pain is worse with deep breathing.  No PMH of blood clots, no lower extremity swelling or pain.  Work-up in emergency department D. Dimer was elevated at 3.2.  UA shows mod HGB, neg LE, nitrites, or WBCs.  Tm 100.9.  HR 109. WBC 10.8k. CTA chest reveals B PEs with R heart strain.  LLL PNA, and probable RLL PNA. Started on heparin gtt, rocephin, and azithro. 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   DVT prophylaxis: Heparin drip Code Status: (Full Family Communication: Mother and brother are at bedside  disposition Plan:  Home    Procedures:  Venous Dopplers  Antimicrobials:  Rocephin 09/13/2018 Zithromax 09/13/2018  Subjective: Complaining of pain with deep breathing  Objective: Vitals:   09/14/18 1104 09/14/18 1325 09/14/18 1458 09/14/18 1459  BP: 112/75 132/85  122/68  Pulse: 77 88  98  Resp: Temp:    (!) 100.6 F (38.1 C)  TempSrc:    Oral  SpO2: 95% 96%  96%  Weight:      Height:   6' (1.829 m)     Intake/Output Summary (Last 24 hours) at 09/14/2018 1524 Last data filed at 09/14/2018 1325 Gross per 24 hour  Intake -  Output 680 ml  Net -680 ml   Filed Weights   09/14/18 0111  Weight: 106.6 kg    Examination:  General exam: Appears calm and comfortable  Respiratory system: Clear to auscultation. Respiratory effort normal. Cardiovascular system: S1 & S2 heard, RRR. No JVD, murmurs, rubs, gallops or clicks. No pedal edema. Gastrointestinal system: Abdomen is nondistended, soft and nontender. No organomegaly or masses felt. Normal bowel sounds heard. Central nervous system: Alert and oriented. No focal neurological deficits. Extremities: Symmetric 5 x 5 power. Skin: No rashes, lesions or ulcers Psychiatry: Judgement and insight appear normal. Mood & affect appropriate.     Data Reviewed: I have personally reviewed following labs and imaging studies  CBC: Recent Labs  Lab 09/14/18 0002  WBC 10.8*  NEUTROABS 7.1  HGB 13.3  HCT 42.8  MCV 82.5  PLT 278   Basic Metabolic Panel: Recent Labs  Lab 09/14/18 0002  NA 138  K 3.9  CL 102  CO2 25  GLUCOSE 94  BUN 10  CREATININE 1.16  CALCIUM 9.6   GFR: Estimated Creatinine Clearance: 121.8 mL/min (by C-G formula based on SCr of 1.16 mg/dL). Liver Function Tests: No results for input(s): AST, ALT, ALKPHOS, BILITOT, PROT, ALBUMIN in the last 168 hours. No results for input(s): LIPASE, AMYLASE in the last 168 hours. No results for input(s): AMMONIA in the last 168 hours. Coagulation Profile: No results for  input(s): INR, PROTIME in the last 168 hours. Cardiac Enzymes: No results for input(s): CKTOTAL, CKMB, CKMBINDEX, TROPONINI in the last 168 hours. BNP (last 3 results) No results for input(s): PROBNP in the last 8760 hours. HbA1C: No results for input(s): HGBA1C in the last 72 hours. CBG: No results for input(s): GLUCAP in the last 168 hours. Lipid Profile: No results for input(s): CHOL, HDL, LDLCALC, TRIG, CHOLHDL, LDLDIRECT in the last 72 hours. Thyroid Function Tests: No results for input(s): TSH, T4TOTAL, FREET4, T3FREE, THYROIDAB in the last 72 hours. Anemia Panel: No results for input(s): VITAMINB12, FOLATE, FERRITIN, TIBC, IRON, RETICCTPCT in the last 72 hours. Sepsis Labs: No results for input(s): PROCALCITON, LATICACIDVEN in the last 168 hours.  Recent Results (from the past 240 hour(s))  Culture, blood (routine x 2) Call MD if unable to obtain prior to antibiotics being given     Status: None (Preliminary result)   Collection Time: 09/14/18  5:25 AM  Result Value Ref Range Status   Specimen Description BLOOD RIGHT HAND  Final   Special Requests   Final    BOTTLES DRAWN AEROBIC AND ANAEROBIC Blood Culture results may not be optimal due to an excessive volume of blood received in culture bottles PATIENT ON FOLLOWING ZITHROMAX,ROCEPHIN Performed at [facility], [address] [place], [place] [address]    Culture PENDING  Incomplete   Report Status PENDING  Incomplete  Culture, blood (routine x 2) Call MD if unable to obtain prior to antibiotics being given     Status: None (Preliminary result)   Collection Time: 09/14/18  5:25 AM  Result Value Ref Range Status   Specimen Description BLOOD LEFT HAND  Final   Special Requests   Final    BOTTLES DRAWN AEROBIC ONLY Blood Culture adequate volume PATIENT ON FOLLOWING ZITHROMAX,ROCEPHIN Performed at [facility], [address], [place], [place] [address]    Culture PENDING  Incomplete   Report Status PENDING   Incomplete         Radiology Studies: Dg Chest 2 View  Result Date: 09/14/2018 CLINICAL DATA:  Left-sided kidney stone and pneumonia diagnosis. Recent return from [place]. EXAM: CHEST - 2 VIEW COMPARISON:  None. FINDINGS: Shallow inspiration. Infiltration or atelectasis in both lung bases, greater on the left. Blunting of the left costophrenic angle consistent with fluid or pleural thickening. No pneumothorax. Heart size and pulmonary vascularity are normal. Mediastinal contours appear intact. IMPRESSION: Shallow inspiration with infiltration or atelectasis in both lung bases. Fluid or thickened pleura in the left costophrenic angle. Electronically Signed   By: Burman Nieves M.D.   On: 09/14/2018 00:33   Ct Angio Chest Pe W And/or Wo Contrast  Result Date: 09/14/2018 CLINICAL DATA:  Shortness of breath, chest and flank pain. Positive D-dimer. History of kidney stones. EXAM: CT ANGIOGRAPHY CHEST CT ABDOMEN AND PELVIS WITH CONTRAST TECHNIQUE: Multidetector CT imaging of the chest was performed using the standard protocol during bolus administration of intravenous contrast. Multiplanar CT image reconstructions and MIPs  were obtained to evaluate the vascular anatomy. Multidetector CT imaging of the abdomen and pelvis was performed using the standard protocol during bolus administration of intravenous contrast. CONTRAST:  <See Chart> ISOVUE-370 IOPAMIDOL (ISOVUE-370) INJECTION 76% COMPARISON:  None. FINDINGS: CTA CHEST FINDINGS CARDIOVASCULAR: Adequate contrast opacification of the pulmonary artery's. Main pulmonary artery is not enlarged. Nonocclusive bilateral lower lobe segmental to subsegmental pulmonary emboli. Heart size is normal, RIGHT heart strain (RV/LV equals 1). No pericardial effusion. Thoracic aorta is normal course and caliber, unremarkable. MEDIASTINUM/NODES: No lymphadenopathy by CT size criteria. LUNGS/PLEURA: Tracheobronchial tree is patent, no pneumothorax. Patchy consolidation LEFT  > RIGHT lung base with small LEFT pleural effusion. MUSCULOSKELETAL: Visualized soft tissues and included osseous structures appear normal. Review of the MIP images confirms the above findings. CT ABDOMEN and PELVIS FINDINGS HEPATOBILIARY: Liver and gallbladder are normal. PANCREAS: Normal. SPLEEN: Normal. ADRENALS/URINARY TRACT: Kidneys are orthotopic, demonstrating symmetric enhancement. No nephrolithiasis, hydronephrosis or solid renal masses. Early excretion of contrast decreases sensitivity for small nonobstructing calculi. The unopacified ureters are normal in course and caliber. Urinary bladder is partially distended and unremarkable. Normal adrenal glands. STOMACH/BOWEL: The stomach, small and large bowel are normal in course and caliber without inflammatory changes. Normal appendix. VASCULAR/LYMPHATIC: Aortoiliac vessels are normal in course and caliber. No lymphadenopathy by CT size criteria. REPRODUCTIVE: Normal. OTHER: No intraperitoneal free fluid or free air. MUSCULOSKELETAL: Nonacute.  Moderate L4-5 disc osteophyte complex. IMPRESSION: CTA CHEST: 1. Acute bilateral lower lobe nonocclusive segmental to subsegmental pulmonary emboli. CT evidence of right heart strain (RV/LV Ratio = 1) consistent with at least submassive (intermediate risk) PE. The presence of right heart strain has been associated with an increased risk of morbidity and mortality. Please activate Code PE by paging [phone]. 2. LEFT lower lobe pneumonia, probable RIGHT lower lobe pneumonia. Bibasilar atelectasis. Small LEFT pleural effusion. CT ABDOMEN AND PELVIS: 1. No acute intra-abdominal/pelvic disease. Critical Value/emergent results were called by telephone at the time of interpretation on 09/14/2018 at 4:06 am to Dr. Drema Pry , who verbally acknowledged these results. Electronically Signed   By: Awilda Metro M.D.   On: 09/14/2018 04:07   Ct Abdomen Pelvis W Contrast  Result Date: 09/14/2018 CLINICAL DATA:  Shortness  of breath, chest and flank pain. Positive D-dimer. History of kidney stones. EXAM: CT ANGIOGRAPHY CHEST CT ABDOMEN AND PELVIS WITH CONTRAST TECHNIQUE: Multidetector CT imaging of the chest was performed using the standard protocol during bolus administration of intravenous contrast. Multiplanar CT image reconstructions and MIPs were obtained to evaluate the vascular anatomy. Multidetector CT imaging of the abdomen and pelvis was performed using the standard protocol during bolus administration of intravenous contrast. CONTRAST:  <See Chart> ISOVUE-370 IOPAMIDOL (ISOVUE-370) INJECTION 76% COMPARISON:  None. FINDINGS: CTA CHEST FINDINGS CARDIOVASCULAR: Adequate contrast opacification of the pulmonary artery's. Main pulmonary artery is not enlarged. Nonocclusive bilateral lower lobe segmental to subsegmental pulmonary emboli. Heart size is normal, RIGHT heart strain (RV/LV equals 1). No pericardial effusion. Thoracic aorta is normal course and caliber, unremarkable. MEDIASTINUM/NODES: No lymphadenopathy by CT size criteria. LUNGS/PLEURA: Tracheobronchial tree is patent, no pneumothorax. Patchy consolidation LEFT > RIGHT lung base with small LEFT pleural effusion. MUSCULOSKELETAL: Visualized soft tissues and included osseous structures appear normal. Review of the MIP images confirms the above findings. CT ABDOMEN and PELVIS FINDINGS HEPATOBILIARY: Liver and gallbladder are normal. PANCREAS: Normal. SPLEEN: Normal. ADRENALS/URINARY TRACT: Kidneys are orthotopic, demonstrating symmetric enhancement. No nephrolithiasis, hydronephrosis or solid renal masses. Early excretion of contrast decreases sensitivity for small nonobstructing calculi.  The unopacified ureters are normal in course and caliber. Urinary bladder is partially distended and unremarkable. Normal adrenal glands. STOMACH/BOWEL: The stomach, small and large bowel are normal in course and caliber without inflammatory changes. Normal appendix. VASCULAR/LYMPHATIC:  Aortoiliac vessels are normal in course and caliber. No lymphadenopathy by CT size criteria. REPRODUCTIVE: Normal. OTHER: No intraperitoneal free fluid or free air. MUSCULOSKELETAL: Nonacute.  Moderate L4-5 disc osteophyte complex. IMPRESSION: CTA CHEST: 1. Acute bilateral lower lobe nonocclusive segmental to subsegmental pulmonary emboli. CT evidence of right heart strain (RV/LV Ratio = 1) consistent with at least submassive (intermediate risk) PE. The presence of right heart strain has been associated with an increased risk of morbidity and mortality. Please activate Code PE by paging [phone]. 2. LEFT lower lobe pneumonia, probable RIGHT lower lobe pneumonia. Bibasilar atelectasis. Small LEFT pleural effusion. CT ABDOMEN AND PELVIS: 1. No acute intra-abdominal/pelvic disease. Critical Value/emergent results were called by telephone at the time of interpretation on 09/14/2018 at 4:06 am to Dr. Drema Pry , who verbally acknowledged these results. Electronically Signed   By: Awilda Metro M.D.   On: 09/14/2018 04:07   Vas [place] Lower Extremity Venous (dvt)  Result Date: 09/14/2018  Lower Venous Study Indications: Pulmonary embolism.  Performing Technologist: Blanch Media RVS  Examination Guidelines: A complete evaluation includes B-mode imaging, spectral Doppler, color Doppler, and power Doppler as needed of all accessible portions of each vessel. Bilateral testing is considered an integral part of a complete examination. Limited examinations for reoccurring indications may be performed as noted.  Right Venous Findings: +---------+---------------+---------+-----------+----------+--------------+          CompressibilityPhasicitySpontaneityPropertiesSummary        +---------+---------------+---------+-----------+----------+--------------+ CFV      Full           Yes      Yes                                 +---------+---------------+---------+-----------+----------+--------------+ SFJ       Full                                                        +---------+---------------+---------+-----------+----------+--------------+ FV Prox  Full                                                        +---------+---------------+---------+-----------+----------+--------------+ FV Mid   Full                                                        +---------+---------------+---------+-----------+----------+--------------+ FV DistalFull                                                        +---------+---------------+---------+-----------+----------+--------------+ PFV      Full                                                        +---------+---------------+---------+-----------+----------+--------------+  POP      Full           Yes      Yes                                 +---------+---------------+---------+-----------+----------+--------------+ PTV      Full                                                        +---------+---------------+---------+-----------+----------+--------------+ PERO                                                  Not visualized +---------+---------------+---------+-----------+----------+--------------+ Gastroc  None                                         Acute          +---------+---------------+---------+-----------+----------+--------------+  Left Venous Findings: +---------+---------------+---------+-----------+----------+-------+          CompressibilityPhasicitySpontaneityPropertiesSummary +---------+---------------+---------+-----------+----------+-------+ CFV      Full           Yes      Yes                          +---------+---------------+---------+-----------+----------+-------+ SFJ      Full                                                 +---------+---------------+---------+-----------+----------+-------+ FV Prox  Full                                                  +---------+---------------+---------+-----------+----------+-------+ FV Mid   Full                                                 +---------+---------------+---------+-----------+----------+-------+ FV DistalFull                                                 +---------+---------------+---------+-----------+----------+-------+ PFV      Full                                                 +---------+---------------+---------+-----------+----------+-------+ POP      Full           Yes      Yes                          +---------+---------------+---------+-----------+----------+-------+  PTV      Full                                                 +---------+---------------+---------+-----------+----------+-------+ PERO     Full                                                 +---------+---------------+---------+-----------+----------+-------+    Summary: Right: Findings consistent with acute deep vein thrombosis involving the right gastrocnemius vein. No cystic structure found in the popliteal fossa. Left: There is no evidence of deep vein thrombosis in the lower extremity. No cystic structure found in the popliteal fossa.  *See table(s) above for measurements and observations.    Preliminary         Scheduled Meds: . [START ON 09/15/2018] azithromycin  500 mg Oral Q24H   Continuous Infusions: . [START ON 09/15/2018] cefTRIAXone (ROCEPHIN)  IV    . heparin 1,900 Units/hr (09/14/18 1435)     LOS: 0 days    Time spent: 40 minutes   Farrie Sann, MD [facility] Pager [phone]  If 7PM-7AM, please contact night-coverage [email or web address] Password [email or web address] 09/14/2018, 3:24 PM

## 2018-09-14 NOTE — ED Notes (Signed)
Breakfast Tray Ordered. 

## 2018-09-14 NOTE — Progress Notes (Signed)
ANTICOAGULATION CONSULT NOTE - Initial Consult  Pharmacy Consult for heparin Indication: pulmonary embolus  No Known Allergies  Patient Measurements: Height: 6' (182.9 cm) Weight: 235 lb (106.6 kg) IBW/kg (Calculated) : 77.6 Heparin Dosing Weight: 100kg  Vital Signs: Temp: 100.9 F (38.3 C) (03/09 0041) Temp Source: Rectal (03/09 0041) BP: 133/86 (03/09 0130) Pulse Rate: 91 (03/09 0415)  Labs: Recent Labs    09/14/18 0002  HGB 13.3  HCT 42.8  PLT 278  CREATININE 1.16    Estimated Creatinine Clearance: 121.8 mL/min (by C-G formula based on SCr of 1.16 mg/dL).   Medical History: Past Medical History:  Diagnosis Date  . Kidney stone   . 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  Goal of Therapy:  Heparin level 0.3-0.7 units/ml Monitor platelets by anticoagulation protocol: Yes   Plan:  Will give heparin 6000 units IV bolus x1 followed by gtt at 1800 units/hr and monitor heparin levels and CBC.  Vernard Gambles, PharmD, BCPS  09/14/2018,4:35 AM

## 2018-09-14 NOTE — ED Notes (Signed)
ED TO INPATIENT HANDOFF REPORT  ED Nurse Name and Phone #: [phone] Zettie Cooley Name/Age/Gender Johnny Ramsey 28 y.o. male Room/Bed: H020C/H020C  Code Status   Code Status: Full Code  Home/SNF/Other Home Patient oriented to: self, place, time and situation Is this baseline? Yes   Triage Complete: Triage complete  Chief Complaint kidney stones   Triage Note Pt states that he just got back from [place] and was diagnosed with L sided kidney stone and pneumonia. Pain is getting worse, pt has prescription but hasn't had it filled yet.    Allergies No Known Allergies  Level of Care/Admitting Diagnosis ED Disposition    ED Disposition Condition Comment   Admit  Hospital Area: [facility] [[id number]]  Level of Care: Cardiac Telemetry [103]  Diagnosis: Bilateral pulmonary embolism [facility]) [[id number]]  Admitting Physician: Wyvonnia Dusky  Attending Physician: Hillary Bow [phone]  Estimated length of stay: past midnight tomorrow  Certification:: I certify this patient will need inpatient services for at least 2 midnights  PT Class (Do Not Modify): Inpatient [101]  PT Acc Code (Do Not Modify): Private [1]       B Medical/Surgery History Past Medical History:  Diagnosis Date  . Kidney stone   . Pneumonia    History reviewed. No pertinent surgical history.   A IV Location/Drains/Wounds Patient Lines/Drains/Airways Status   Active Line/Drains/Airways    Name:   Placement date:   Placement time:   Site:   Days:   Peripheral IV 09/14/18 Left Forearm   09/14/18    0033    Forearm   less than 1   Peripheral IV 09/14/18 Right Forearm   09/14/18    0431    Forearm   less than 1          Intake/Output Last 24 hours  Intake/Output Summary (Last 24 hours) at 09/14/2018 1351 Last data filed at 09/14/2018 1325 Gross per 24 hour  Intake -  Output 680 ml  Net -680 ml    Labs/Imaging Results for orders placed or performed during the hospital  encounter of 09/13/18 (from the past 48 hour(s))  CBC with Differential     Status: Abnormal   Collection Time: 09/14/18 12:02 AM  Result Value Ref Range   WBC 10.8 (H) 4.0 - 10.5 K/uL   RBC 5.19 4.22 - 5.81 MIL/uL   Hemoglobin 13.3 13.0 - 17.0 g/dL   HCT 47.8 29.5 - 62.1 %   MCV 82.5 80.0 - 100.0 fL   MCH 25.6 (L) 26.0 - 34.0 pg   MCHC 31.1 30.0 - 36.0 g/dL   RDW 30.8 65.7 - 84.6 %   Platelets 278 150 - 400 K/uL   nRBC 0.0 0.0 - 0.2 %   Neutrophils Relative % 66 %   Neutro Abs 7.1 1.7 - 7.7 K/uL   Lymphocytes Relative 23 %   Lymphs Abs 2.4 0.7 - 4.0 K/uL   Monocytes Relative 10 %   Monocytes Absolute 1.0 0.1 - 1.0 K/uL   Eosinophils Relative 1 %   Eosinophils Absolute 0.1 0.0 - 0.5 K/uL   Basophils Relative 0 %   Basophils Absolute 0.0 0.0 - 0.1 K/uL   Immature Granulocytes 0 %   Abs Immature Granulocytes 0.03 0.00 - 0.07 K/uL    Comment: Performed at [facility], [address], [place], [place] [address]  Basic metabolic panel     Status: None   Collection Time: 09/14/18 12:02 AM  Result Value Ref Range   Sodium 138 135 - 145 mmol/L   Potassium 3.9 3.5 - 5.1 mmol/L   Chloride 102 98 - 111 mmol/L   CO2 25 22 - 32 mmol/L   Glucose, Bld 94 70 - 99 mg/dL   BUN 10 6 - 20 mg/dL   Creatinine, Ser 1.61 0.61 - 1.24 mg/dL   Calcium 9.6 8.9 - 09.6 mg/dL   GFR calc non Af Amer >60 >60 mL/min   GFR calc Af Amer >60 >60 mL/min   Anion gap 11 5 - 15    Comment: Performed at [facility], [address], [place], [place] [address]  D-dimer, quantitative (not at [facility])     Status: Abnormal   Collection Time: 09/14/18 12:02 AM  Result Value Ref Range   D-Dimer, Quant 3.21 (H) 0.00 - 0.50 ug/mL-FEU    Comment: (NOTE) At the manufacturer cut-off of 0.50 ug/mL FEU, this assay has been documented to exclude PE with a sensitivity and negative predictive value of 97 to 99%.  At this time, this assay has not been approved by the FDA to exclude DVT/VTE. Results should be  correlated with clinical presentation. Performed at [facility], [address], [place], [place] [address]   Urinalysis, Routine w reflex microscopic     Status: Abnormal   Collection Time: 09/14/18 12:34 AM  Result Value Ref Range   Color, Urine YELLOW YELLOW   APPearance CLEAR CLEAR   Specific Gravity, Urine 1.024 1.005 - 1.030   pH 5.0 5.0 - 8.0   Glucose, UA NEGATIVE NEGATIVE mg/dL   Hgb urine dipstick MODERATE (A) NEGATIVE   Bilirubin Urine NEGATIVE NEGATIVE   Ketones, ur 5 (A) NEGATIVE mg/dL   Protein, ur NEGATIVE NEGATIVE mg/dL   Nitrite NEGATIVE NEGATIVE   Leukocytes,Ua NEGATIVE NEGATIVE   RBC / HPF 0-5 0 - 5 RBC/hpf   WBC, UA 0-5 0 - 5 WBC/hpf   Bacteria, UA NONE SEEN NONE SEEN   Squamous Epithelial / LPF 0-5 0 - 5   Mucus PRESENT     Comment: Performed at [facility], [address], [place], [place] [address]  Strep pneumoniae urinary antigen     Status: None   Collection Time: 09/14/18  4:47 AM  Result Value Ref Range   Strep Pneumo Urinary Antigen NEGATIVE NEGATIVE    Comment:        Infection due to S. pneumoniae cannot be absolutely ruled out since the antigen present may be below the detection limit of the test. Performed at [facility], [address], [place], [place] [address]   Culture, blood (routine x 2) Call MD if unable to obtain prior to antibiotics being given     Status: None (Preliminary result)   Collection Time: 09/14/18  5:25 AM  Result Value Ref Range   Specimen Description BLOOD RIGHT HAND    Special Requests      BOTTLES DRAWN AEROBIC AND ANAEROBIC Blood Culture results may not be optimal due to an excessive volume of blood received in culture bottles PATIENT ON FOLLOWING ZITHROMAX,ROCEPHIN Performed at [facility], [address] [place], [place] [address]    Culture PENDING    Report Status PENDING   Culture, blood (routine x 2) Call MD if unable to obtain prior to antibiotics being given     Status: None  (Preliminary result)   Collection Time: 09/14/18  5:25 AM  Result Value Ref Range  Specimen Description BLOOD LEFT HAND    Special Requests      BOTTLES DRAWN AEROBIC ONLY Blood Culture adequate volume PATIENT ON FOLLOWING ZITHROMAX,ROCEPHIN Performed at [facility], [address] [place], [place] [address]    Culture PENDING    Report Status PENDING   HIV antibody (Routine Screening)     Status: None   Collection Time: 09/14/18  5:25 AM  Result Value Ref Range   HIV Screen 4th Generation wRfx Non Reactive Non Reactive    Comment: (NOTE) Performed At: [facility] [address] [place], [place] [id number] Jolene SchimkeNagendra Sanjai MD NW:[id number]:[phone]   Influenza panel by PCR (type A & B)     Status: None   Collection Time: 09/14/18  6:02 AM  Result Value Ref Range   Influenza A By PCR NEGATIVE NEGATIVE   Influenza B By PCR NEGATIVE NEGATIVE    Comment: (NOTE) The Xpert Xpress Flu assay is intended as an aid in the diagnosis of  influenza and should not be used as a sole basis for treatment.  This  assay is FDA approved for nasopharyngeal swab specimens only. Nasal  washings and aspirates are unacceptable for Xpert Xpress Flu testing. Performed at [facility], [address] [place], [place] [address]    Dg Chest 2 View  Result Date: 09/14/2018 CLINICAL DATA:  Left-sided kidney stone and pneumonia diagnosis. Recent return from [place]. EXAM: CHEST - 2 VIEW COMPARISON:  None. FINDINGS: Shallow inspiration. Infiltration or atelectasis in both lung bases, greater on the left. Blunting of the left costophrenic angle consistent with fluid or pleural thickening. No pneumothorax. Heart size and pulmonary vascularity are normal. Mediastinal contours appear intact. IMPRESSION: Shallow inspiration with infiltration or atelectasis in both lung bases. Fluid or thickened pleura in the left costophrenic angle. Electronically Signed   By: Burman NievesWilliam  Stevens M.D.   On: 09/14/2018 00:33    Ct Angio Chest Pe W And/or Wo Contrast  Result Date: 09/14/2018 CLINICAL DATA:  Shortness of breath, chest and flank pain. Positive D-dimer. History of kidney stones. EXAM: CT ANGIOGRAPHY CHEST CT ABDOMEN AND PELVIS WITH CONTRAST TECHNIQUE: Multidetector CT imaging of the chest was performed using the standard protocol during bolus administration of intravenous contrast. Multiplanar CT image reconstructions and MIPs were obtained to evaluate the vascular anatomy. Multidetector CT imaging of the abdomen and pelvis was performed using the standard protocol during bolus administration of intravenous contrast. CONTRAST:  <See Chart> ISOVUE-370 IOPAMIDOL (ISOVUE-370) INJECTION 76% COMPARISON:  None. FINDINGS: CTA CHEST FINDINGS CARDIOVASCULAR: Adequate contrast opacification of the pulmonary artery's. Main pulmonary artery is not enlarged. Nonocclusive bilateral lower lobe segmental to subsegmental pulmonary emboli. Heart size is normal, RIGHT heart strain (RV/LV equals 1). No pericardial effusion. Thoracic aorta is normal course and caliber, unremarkable. MEDIASTINUM/NODES: No lymphadenopathy by CT size criteria. LUNGS/PLEURA: Tracheobronchial tree is patent, no pneumothorax. Patchy consolidation LEFT > RIGHT lung base with small LEFT pleural effusion. MUSCULOSKELETAL: Visualized soft tissues and included osseous structures appear normal. Review of the MIP images confirms the above findings. CT ABDOMEN and PELVIS FINDINGS HEPATOBILIARY: Liver and gallbladder are normal. PANCREAS: Normal. SPLEEN: Normal. ADRENALS/URINARY TRACT: Kidneys are orthotopic, demonstrating symmetric enhancement. No nephrolithiasis, hydronephrosis or solid renal masses. Early excretion of contrast decreases sensitivity for small nonobstructing calculi. The unopacified ureters are normal in course and caliber. Urinary bladder is partially distended and unremarkable. Normal adrenal glands. STOMACH/BOWEL: The stomach, small and large bowel are  normal in course and caliber without inflammatory changes.  Normal appendix. VASCULAR/LYMPHATIC: Aortoiliac vessels are normal in course and caliber. No lymphadenopathy by CT size criteria. REPRODUCTIVE: Normal. OTHER: No intraperitoneal free fluid or free air. MUSCULOSKELETAL: Nonacute.  Moderate L4-5 disc osteophyte complex. IMPRESSION: CTA CHEST: 1. Acute bilateral lower lobe nonocclusive segmental to subsegmental pulmonary emboli. CT evidence of right heart strain (RV/LV Ratio = 1) consistent with at least submassive (intermediate risk) PE. The presence of right heart strain has been associated with an increased risk of morbidity and mortality. Please activate Code PE by paging [phone]. 2. LEFT lower lobe pneumonia, probable RIGHT lower lobe pneumonia. Bibasilar atelectasis. Small LEFT pleural effusion. CT ABDOMEN AND PELVIS: 1. No acute intra-abdominal/pelvic disease. Critical Value/emergent results were called by telephone at the time of interpretation on 09/14/2018 at 4:06 am to Dr. Drema Pry , who verbally acknowledged these results. Electronically Signed   By: Awilda Metro M.D.   On: 09/14/2018 04:07   Ct Abdomen Pelvis W Contrast  Result Date: 09/14/2018 CLINICAL DATA:  Shortness of breath, chest and flank pain. Positive D-dimer. History of kidney stones. EXAM: CT ANGIOGRAPHY CHEST CT ABDOMEN AND PELVIS WITH CONTRAST TECHNIQUE: Multidetector CT imaging of the chest was performed using the standard protocol during bolus administration of intravenous contrast. Multiplanar CT image reconstructions and MIPs were obtained to evaluate the vascular anatomy. Multidetector CT imaging of the abdomen and pelvis was performed using the standard protocol during bolus administration of intravenous contrast. CONTRAST:  <See Chart> ISOVUE-370 IOPAMIDOL (ISOVUE-370) INJECTION 76% COMPARISON:  None. FINDINGS: CTA CHEST FINDINGS CARDIOVASCULAR: Adequate contrast opacification of the pulmonary artery's. Main  pulmonary artery is not enlarged. Nonocclusive bilateral lower lobe segmental to subsegmental pulmonary emboli. Heart size is normal, RIGHT heart strain (RV/LV equals 1). No pericardial effusion. Thoracic aorta is normal course and caliber, unremarkable. MEDIASTINUM/NODES: No lymphadenopathy by CT size criteria. LUNGS/PLEURA: Tracheobronchial tree is patent, no pneumothorax. Patchy consolidation LEFT > RIGHT lung base with small LEFT pleural effusion. MUSCULOSKELETAL: Visualized soft tissues and included osseous structures appear normal. Review of the MIP images confirms the above findings. CT ABDOMEN and PELVIS FINDINGS HEPATOBILIARY: Liver and gallbladder are normal. PANCREAS: Normal. SPLEEN: Normal. ADRENALS/URINARY TRACT: Kidneys are orthotopic, demonstrating symmetric enhancement. No nephrolithiasis, hydronephrosis or solid renal masses. Early excretion of contrast decreases sensitivity for small nonobstructing calculi. The unopacified ureters are normal in course and caliber. Urinary bladder is partially distended and unremarkable. Normal adrenal glands. STOMACH/BOWEL: The stomach, small and large bowel are normal in course and caliber without inflammatory changes. Normal appendix. VASCULAR/LYMPHATIC: Aortoiliac vessels are normal in course and caliber. No lymphadenopathy by CT size criteria. REPRODUCTIVE: Normal. OTHER: No intraperitoneal free fluid or free air. MUSCULOSKELETAL: Nonacute.  Moderate L4-5 disc osteophyte complex. IMPRESSION: CTA CHEST: 1. Acute bilateral lower lobe nonocclusive segmental to subsegmental pulmonary emboli. CT evidence of right heart strain (RV/LV Ratio = 1) consistent with at least submassive (intermediate risk) PE. The presence of right heart strain has been associated with an increased risk of morbidity and mortality. Please activate Code PE by paging [phone]. 2. LEFT lower lobe pneumonia, probable RIGHT lower lobe pneumonia. Bibasilar atelectasis. Small LEFT pleural  effusion. CT ABDOMEN AND PELVIS: 1. No acute intra-abdominal/pelvic disease. Critical Value/emergent results were called by telephone at the time of interpretation on 09/14/2018 at 4:06 am to Dr. Drema Pry , who verbally acknowledged these results. Electronically Signed   By: Awilda Metro M.D.   On: 09/14/2018 04:07   Vas [place] Lower Extremity Venous (dvt)  Result Date: 09/14/2018  Lower Venous  Study Indications: Pulmonary embolism.  Performing Technologist: Blanch Media RVS  Examination Guidelines: A complete evaluation includes B-mode imaging, spectral Doppler, color Doppler, and power Doppler as needed of all accessible portions of each vessel. Bilateral testing is considered an integral part of a complete examination. Limited examinations for reoccurring indications may be performed as noted.  Right Venous Findings: +---------+---------------+---------+-----------+----------+--------------+          CompressibilityPhasicitySpontaneityPropertiesSummary        +---------+---------------+---------+-----------+----------+--------------+ CFV      Full           Yes      Yes                                 +---------+---------------+---------+-----------+----------+--------------+ SFJ      Full                                                        +---------+---------------+---------+-----------+----------+--------------+ FV Prox  Full                                                        +---------+---------------+---------+-----------+----------+--------------+ FV Mid   Full                                                        +---------+---------------+---------+-----------+----------+--------------+ FV DistalFull                                                        +---------+---------------+---------+-----------+----------+--------------+ PFV      Full                                                         +---------+---------------+---------+-----------+----------+--------------+ POP      Full           Yes      Yes                                 +---------+---------------+---------+-----------+----------+--------------+ PTV      Full                                                        +---------+---------------+---------+-----------+----------+--------------+ PERO  Not visualized +---------+---------------+---------+-----------+----------+--------------+ Gastroc  None                                         Acute          +---------+---------------+---------+-----------+----------+--------------+  Left Venous Findings: +---------+---------------+---------+-----------+----------+-------+          CompressibilityPhasicitySpontaneityPropertiesSummary +---------+---------------+---------+-----------+----------+-------+ CFV      Full           Yes      Yes                          +---------+---------------+---------+-----------+----------+-------+ SFJ      Full                                                 +---------+---------------+---------+-----------+----------+-------+ FV Prox  Full                                                 +---------+---------------+---------+-----------+----------+-------+ FV Mid   Full                                                 +---------+---------------+---------+-----------+----------+-------+ FV DistalFull                                                 +---------+---------------+---------+-----------+----------+-------+ PFV      Full                                                 +---------+---------------+---------+-----------+----------+-------+ POP      Full           Yes      Yes                          +---------+---------------+---------+-----------+----------+-------+ PTV      Full                                                  +---------+---------------+---------+-----------+----------+-------+ PERO     Full                                                 +---------+---------------+---------+-----------+----------+-------+    Summary: Right: Findings consistent with acute deep vein thrombosis involving the right gastrocnemius vein. No cystic structure found in the popliteal fossa. Left: There is no evidence of deep vein thrombosis in the lower extremity. No cystic structure found in the popliteal  fossa.  *See table(s) above for measurements and observations.    Preliminary     Pending Labs Unresulted Labs (From admission, onward)    Start     Ordered   09/15/18 0500  Heparin level (unfractionated)  Daily,   R     09/14/18 0440   09/15/18 0500  CBC  Daily,   R     09/14/18 0440   09/14/18 1200  Heparin level (unfractionated)  Once-Timed,   R     09/14/18 0440   09/14/18 0447  Culture, sputum-assessment  Once,   R     09/14/18 0450   09/14/18 0447  Gram stain  Once,   R     09/14/18 0450          Vitals/Pain Today's Vitals   09/14/18 0937 09/14/18 1104 09/14/18 1104 09/14/18 1325  BP: 110/78  112/75   Pulse: 89  77 88  Resp: 16  20 16   Temp:      TempSrc:      SpO2: 99%  95% 96%  Weight:      Height:      PainSc:  3       Isolation Precautions Droplet precaution  Medications Medications  heparin ADULT infusion 100 units/mL (25000 units/257mL sodium chloride 0.45%) (1,800 Units/hr Intravenous Rate/Dose Verify 09/14/18 1325)  cefTRIAXone (ROCEPHIN) 1 g in sodium chloride 0.9 % 100 mL IVPB (has no administration in time range)  azithromycin (ZITHROMAX) tablet 500 mg (has no administration in time range)  ketorolac (TORADOL) 15 MG/ML injection 15 mg (15 mg Intravenous Given 09/14/18 0034)  sodium chloride 0.9 % bolus 1,000 mL (0 mLs Intravenous Stopped 09/14/18 0943)  morphine 4 MG/ML injection 4 mg (4 mg Intravenous Given 09/14/18 0109)  HYDROmorphone (DILAUDID) injection 1 mg (1 mg Intravenous  Given 09/14/18 0151)  diazepam (VALIUM) injection 2.5 mg (2.5 mg Intravenous Given 09/14/18 0232)  iopamidol (ISOVUE-370) 76 % injection 100 mL (100 mLs Intravenous Contrast Given 09/14/18 0329)  cefTRIAXone (ROCEPHIN) 1 g in sodium chloride 0.9 % 100 mL IVPB (0 g Intravenous Stopped 09/14/18 0521)  azithromycin (ZITHROMAX) 500 mg in sodium chloride 0.9 % 250 mL IVPB (0 mg Intravenous Stopped 09/14/18 0601)  heparin bolus via infusion 6,000 Units (6,000 Units Intravenous Bolus from Bag 09/14/18 0445)  morphine 4 MG/ML injection 4 mg (4 mg Intravenous Given 09/14/18 0932)    Mobility walks Low fall risk   Focused Assessments Pulmonary Assessment Handoff:  Lung sounds:   O2 Device: Room Air        R Recommendations: See Admitting Provider Note  Report given to:   Additional Notes: Pt has LLE DVT, bilater PE, and pneumonia. Pt on heparin for blood clots. Pt received rocephin for pneumonia. Pt negative for flu.

## 2018-09-14 NOTE — Progress Notes (Signed)
ANTICOAGULATION CONSULT NOTE  Pharmacy Consult for heparin Indication: pulmonary embolus  No Known Allergies  Patient Measurements: Height: 6' (182.9 cm) Weight: 235 lb (106.6 kg) IBW/kg (Calculated) : 77.6 Heparin Dosing Weight: 100kg  Vital Signs: Temp: 99.1 F (37.3 C) (03/09 2101) Temp Source: Oral (03/09 2101) BP: 120/76 (03/09 2101) Pulse Rate: 89 (03/09 2101)  Labs: Recent Labs    09/14/18 0002 09/14/18 1329 09/14/18 2031  HGB 13.3  --   --   HCT 42.8  --   --   PLT 278  --   --   HEPARINUNFRC  --  0.33 0.31  CREATININE 1.16  --   --     Estimated Creatinine Clearance: 121.8 mL/min (by C-G formula based on SCr of 1.16 mg/dL).   Medical History: Past Medical History:  Diagnosis Date  . Kidney stone   . 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  Heparin level therapeutic at 0.31 but near bottom of range. No bleed or issues with infusion per RN. CBC wnl. Will increase slightly to ensure stays within therapeutic range  Goal of Therapy:  Heparin level 0.3-0.7 units/ml Monitor platelets by anticoagulation protocol: Yes   Plan:  Increase heparin to 2000 units/hr Monitor daily heparin level and CBC, s/sx bleeding  Leota Sauers Pharm.D. CPP, BCPS Clinical Pharmacist [phone] 09/14/2018 9:42 PM

## 2018-09-15 ENCOUNTER — Encounter (HOSPITAL_COMMUNITY): Payer: Self-pay | Admitting: *Deleted

## 2018-09-15 LAB — BASIC METABOLIC PANEL
Anion gap: 9 (ref 5–15)
BUN: 8 mg/dL (ref 6–20)
CHLORIDE: 105 mmol/L (ref 98–111)
CO2: 23 mmol/L (ref 22–32)
Calcium: 9 mg/dL (ref 8.9–10.3)
Creatinine, Ser: 1 mg/dL (ref 0.61–1.24)
GFR calc Af Amer: >60 mL/min (ref >60)
GFR calc non Af Amer: >60 mL/min (ref >60)
Glucose, Bld: 108 mg/dL — ABNORMAL HIGH (ref 70–99)
Potassium: 3.9 mmol/L (ref 3.5–5.1)
Sodium: 137 mmol/L (ref 135–145)

## 2018-09-15 LAB — CBC
HCT: 38.8 % — ABNORMAL LOW (ref 39.0–52.0)
HEMOGLOBIN: 12.2 g/dL — AB (ref 13.0–17.0)
MCH: 25.8 pg — ABNORMAL LOW (ref 26.0–34.0)
MCHC: 31.4 g/dL (ref 30.0–36.0)
MCV: 82 fL (ref 80.0–100.0)
Platelets: 284 10*3/uL (ref 150–400)
RBC: 4.73 MIL/uL (ref 4.22–5.81)
RDW: 13.1 % (ref 11.5–15.5)
WBC: 7.8 10*3/uL (ref 4.0–10.5)
nRBC: 0 % (ref 0.0–0.2)

## 2018-09-15 LAB — HEPARIN LEVEL (UNFRACTIONATED): Heparin Unfractionated: 0.43 IU/mL (ref 0.30–0.70)

## 2018-09-15 NOTE — Progress Notes (Signed)
ANTICOAGULATION CONSULT NOTE  Pharmacy Consult for heparin Indication: pulmonary embolus  No Known Allergies  Patient Measurements: Height: 6' (182.9 cm) Weight: 247 lb 4.8 oz (112.2 kg) IBW/kg (Calculated) : 77.6 Heparin Dosing Weight: 100kg  Vital Signs: Temp: 98.7 F (37.1 C) (03/10 0640) Temp Source: Oral (03/10 0640) BP: 117/76 (03/10 0640) Pulse Rate: 72 (03/10 0640)  Labs: Recent Labs    09/14/18 0002 09/14/18 1329 09/14/18 2031 09/15/18 0354  HGB 13.3  --   --  12.2*  HCT 42.8  --   --  38.8*  PLT 278  --   --  284  HEPARINUNFRC  --  0.33 0.31 0.43  CREATININE 1.16  --   --  1.00    Estimated Creatinine Clearance: 144.7 mL/min (by C-G formula based on SCr of 1 mg/dL).   Medical History: Past Medical History:  Diagnosis Date  . Kidney stone   . 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 -heparin level at goal -hg= 12.3, plt= 284  Goal of Therapy:  Heparin level 0.3-0.7 units/ml Monitor platelets by anticoagulation protocol: Yes   Plan:  Continue heparin at 2000 units/hr Monitor daily heparin level and CBC Will follow plans for oral anticoagulation  Harland German, PharmD Clinical Pharmacist **Pharmacist phone directory can now be found on [email or web address] ([email or web address]).  Listed under [facility].

## 2018-09-15 NOTE — Progress Notes (Signed)
PROGRESS NOTE    Johnny BlazeChristopher A Kimber  ZOX:[id number] DOB: 07/06/1991 DOA: 09/13/2018 PCP: Patient, No Pcp Per    Brief Narrative:  Johnny Ramsey is a 28 y.o. 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  Was seen in small ED in [place] for L flank pain.  Diagnosed with small renal stone, and told that they incidentally saw evidence of LLL PNA on the CT scan.  Given script for levoquin which he didn't fill.  Flank pain has been fluctuating in nature, but gradually worsened since onset. He continues to have no cough, no congestion, no SOB.  Pain is worse with deep breathing.  No PMH of blood clots, no lower extremity swelling or pain.  Work-up in emergency department D. Dimer was elevated at 3.2.  UA shows mod HGB, neg LE, nitrites, or WBCs.  Tm 100.9.  HR 109. WBC 10.8k. CTA chest reveals B PEs with R heart strain.  LLL PNA, and probable RLL PNA. Started on heparin gtt, rocephin, and azithro. Assessment & Plan:   Principal Problem:   Bilateral pulmonary embolism (HCC) Active Problems:   Community acquired pneumonia of both lower lobes (HCC)   ##Bilateral pulmonary embolism -CT angiogram showed cute bilateral lower lobe nonocclusive segmental to subsegmental pulmonary emboli with a CT evidence of right heart strain, consistent with submassive PE -Provoked from immobility being a truck driver -Heparin gtt -2d echo -BLE US-DVT involving the right gastrocnemius vein -Continue on telemetry  ##Left lower extremity DVT -Continue with heparin drip -Plan to discharge patient on Xarelto  ##Bilateral lower lobe pneumonia- -Influenza A and B are negative -Strep pneumo urinary-negative -Follow-up with blood cultures -negative to date -Continue with Rocephin, Zithromax -Covid-19 seems unlikely (no cough, no SOB, no URI symptoms, only seems to be affecting lower lobes of lungs, etc).   DVT prophylaxis: Heparin  drip Code Status: (Full Family Communication: Mother and brother are at bedside  disposition Plan: Home    Procedures:  Venous Dopplers  Antimicrobials:  Rocephin 09/13/2018 Zithromax 09/13/2018  Subjective: Improved cough, shortness of breath.  Patient is afebrile in the last 12 hours Objective: Vitals:   09/14/18 1800 09/14/18 2101 09/15/18 0640 09/15/18 0642  BP:  120/76 117/76   Pulse:  89 72   Resp:  17    Temp: 99.5 F (37.5 C) 99.1 F (37.3 C) 98.7 F (37.1 C)   TempSrc: Oral Oral Oral   SpO2:  96% 96%   Weight:    112.2 kg  Height:        Intake/Output Summary (Last 24 hours) at 09/15/2018 1550 Last data filed at 09/15/2018 1500 Gross per 24 hour  Intake 1318.38 ml  Output 750 ml  Net 568.38 ml   Filed Weights   09/14/18 0111 09/15/18 0642  Weight: 106.6 kg 112.2 kg    Examination:  General exam: Appears calm and comfortable  Respiratory system: Clear to auscultation. Respiratory effort normal. Cardiovascular system: S1 & S2 heard, RRR. No JVD, murmurs, rubs, gallops or clicks. No pedal edema. Gastrointestinal system: Abdomen is nondistended, soft and nontender. No organomegaly or masses felt. Normal bowel sounds heard. Central nervous system: Alert and oriented. No focal neurological deficits. Extremities: Symmetric 5 x 5 power. Skin: No rashes, lesions or ulcers Psychiatry: Judgement and insight appear normal. Mood & affect appropriate.     Data Reviewed: I have personally reviewed following labs and imaging studies  CBC: Recent Labs  Lab 09/14/18  0002 09/15/18 0354  WBC 10.8* 7.8  NEUTROABS 7.1  --   HGB 13.3 12.2*  HCT 42.8 38.8*  MCV 82.5 82.0  PLT 278 284   Basic Metabolic Panel: Recent Labs  Lab 09/14/18 0002 09/15/18 0354  NA 138 137  K 3.9 3.9  CL 102 105  CO2 25 23  GLUCOSE 94 108*  BUN 10 8  CREATININE 1.16 1.00  CALCIUM 9.6 9.0   GFR: Estimated Creatinine Clearance: 144.7 mL/min (by C-G formula based on SCr of 1  mg/dL). Liver Function Tests: No results for input(s): AST, ALT, ALKPHOS, BILITOT, PROT, ALBUMIN in the last 168 hours. No results for input(s): LIPASE, AMYLASE in the last 168 hours. No results for input(s): AMMONIA in the last 168 hours. Coagulation Profile: No results for input(s): INR, PROTIME in the last 168 hours. Cardiac Enzymes: No results for input(s): CKTOTAL, CKMB, CKMBINDEX, TROPONINI in the last 168 hours. BNP (last 3 results) No results for input(s): PROBNP in the last 8760 hours. HbA1C: No results for input(s): HGBA1C in the last 72 hours. CBG: No results for input(s): GLUCAP in the last 168 hours. Lipid Profile: No results for input(s): CHOL, HDL, LDLCALC, TRIG, CHOLHDL, LDLDIRECT in the last 72 hours. Thyroid Function Tests: No results for input(s): TSH, T4TOTAL, FREET4, T3FREE, THYROIDAB in the last 72 hours. Anemia Panel: No results for input(s): VITAMINB12, FOLATE, FERRITIN, TIBC, IRON, RETICCTPCT in the last 72 hours. Sepsis Labs: No results for input(s): PROCALCITON, LATICACIDVEN in the last 168 hours.  Recent Results (from the past 240 hour(s))  Culture, blood (routine x 2) Call MD if unable to obtain prior to antibiotics being given     Status: None (Preliminary result)   Collection Time: 09/14/18  5:25 AM  Result Value Ref Range Status   Specimen Description BLOOD RIGHT HAND  Final   Special Requests   Final    BOTTLES DRAWN AEROBIC AND ANAEROBIC Blood Culture results may not be optimal due to an excessive volume of blood received in culture bottles PATIENT ON FOLLOWING ZITHROMAX,ROCEPHIN   Culture   Final    NO GROWTH 1 DAY Performed at [facility], [address], [place], [place] [address]    Report Status PENDING  Incomplete  Culture, blood (routine x 2) Call MD if unable to obtain prior to antibiotics being given     Status: None (Preliminary result)   Collection Time: 09/14/18  5:25 AM  Result Value Ref Range Status   Specimen Description  BLOOD LEFT HAND  Final   Special Requests   Final    BOTTLES DRAWN AEROBIC ONLY Blood Culture adequate volume PATIENT ON FOLLOWING ZITHROMAX,ROCEPHIN   Culture   Final    NO GROWTH 1 DAY Performed at [facility], [address] [place], [place] [address]    Report Status PENDING  Incomplete         Radiology Studies: Dg Chest 2 View  Result Date: 09/14/2018 CLINICAL DATA:  Left-sided kidney stone and pneumonia diagnosis. Recent return from [place]. EXAM: CHEST - 2 VIEW COMPARISON:  None. FINDINGS: Shallow inspiration. Infiltration or atelectasis in both lung bases, greater on the left. Blunting of the left costophrenic angle consistent with fluid or pleural thickening. No pneumothorax. Heart size and pulmonary vascularity are normal. Mediastinal contours appear intact. IMPRESSION: Shallow inspiration with infiltration or atelectasis in both lung bases. Fluid or thickened pleura in the left costophrenic angle. Electronically Signed   By: Marisa Cyphers.D.  On: 09/14/2018 00:33   Ct Angio Chest Pe W And/or Wo Contrast  Result Date: 09/14/2018 CLINICAL DATA:  Shortness of breath, chest and flank pain. Positive D-dimer. History of kidney stones. EXAM: CT ANGIOGRAPHY CHEST CT ABDOMEN AND PELVIS WITH CONTRAST TECHNIQUE: Multidetector CT imaging of the chest was performed using the standard protocol during bolus administration of intravenous contrast. Multiplanar CT image reconstructions and MIPs were obtained to evaluate the vascular anatomy. Multidetector CT imaging of the abdomen and pelvis was performed using the standard protocol during bolus administration of intravenous contrast. CONTRAST:  <See Chart> ISOVUE-370 IOPAMIDOL (ISOVUE-370) INJECTION 76% COMPARISON:  None. FINDINGS: CTA CHEST FINDINGS CARDIOVASCULAR: Adequate contrast opacification of the pulmonary artery's. Main pulmonary artery is not enlarged. Nonocclusive bilateral lower lobe segmental to subsegmental pulmonary  emboli. Heart size is normal, RIGHT heart strain (RV/LV equals 1). No pericardial effusion. Thoracic aorta is normal course and caliber, unremarkable. MEDIASTINUM/NODES: No lymphadenopathy by CT size criteria. LUNGS/PLEURA: Tracheobronchial tree is patent, no pneumothorax. Patchy consolidation LEFT > RIGHT lung base with small LEFT pleural effusion. MUSCULOSKELETAL: Visualized soft tissues and included osseous structures appear normal. Review of the MIP images confirms the above findings. CT ABDOMEN and PELVIS FINDINGS HEPATOBILIARY: Liver and gallbladder are normal. PANCREAS: Normal. SPLEEN: Normal. ADRENALS/URINARY TRACT: Kidneys are orthotopic, demonstrating symmetric enhancement. No nephrolithiasis, hydronephrosis or solid renal masses. Early excretion of contrast decreases sensitivity for small nonobstructing calculi. The unopacified ureters are normal in course and caliber. Urinary bladder is partially distended and unremarkable. Normal adrenal glands. STOMACH/BOWEL: The stomach, small and large bowel are normal in course and caliber without inflammatory changes. Normal appendix. VASCULAR/LYMPHATIC: Aortoiliac vessels are normal in course and caliber. No lymphadenopathy by CT size criteria. REPRODUCTIVE: Normal. OTHER: No intraperitoneal free fluid or free air. MUSCULOSKELETAL: Nonacute.  Moderate L4-5 disc osteophyte complex. IMPRESSION: CTA CHEST: 1. Acute bilateral lower lobe nonocclusive segmental to subsegmental pulmonary emboli. CT evidence of right heart strain (RV/LV Ratio = 1) consistent with at least submassive (intermediate risk) PE. The presence of right heart strain has been associated with an increased risk of morbidity and mortality. Please activate Code PE by paging [phone]. 2. LEFT lower lobe pneumonia, probable RIGHT lower lobe pneumonia. Bibasilar atelectasis. Small LEFT pleural effusion. CT ABDOMEN AND PELVIS: 1. No acute intra-abdominal/pelvic disease. Critical Value/emergent results  were called by telephone at the time of interpretation on 09/14/2018 at 4:06 am to Dr. Drema Pry , who verbally acknowledged these results. Electronically Signed   By: Awilda Metro M.D.   On: 09/14/2018 04:07   Ct Abdomen Pelvis W Contrast  Result Date: 09/14/2018 CLINICAL DATA:  Shortness of breath, chest and flank pain. Positive D-dimer. History of kidney stones. EXAM: CT ANGIOGRAPHY CHEST CT ABDOMEN AND PELVIS WITH CONTRAST TECHNIQUE: Multidetector CT imaging of the chest was performed using the standard protocol during bolus administration of intravenous contrast. Multiplanar CT image reconstructions and MIPs were obtained to evaluate the vascular anatomy. Multidetector CT imaging of the abdomen and pelvis was performed using the standard protocol during bolus administration of intravenous contrast. CONTRAST:  <See Chart> ISOVUE-370 IOPAMIDOL (ISOVUE-370) INJECTION 76% COMPARISON:  None. FINDINGS: CTA CHEST FINDINGS CARDIOVASCULAR: Adequate contrast opacification of the pulmonary artery's. Main pulmonary artery is not enlarged. Nonocclusive bilateral lower lobe segmental to subsegmental pulmonary emboli. Heart size is normal, RIGHT heart strain (RV/LV equals 1). No pericardial effusion. Thoracic aorta is normal course and caliber, unremarkable. MEDIASTINUM/NODES: No lymphadenopathy by CT size criteria. LUNGS/PLEURA: Tracheobronchial tree is patent, no pneumothorax. Patchy consolidation LEFT >  RIGHT lung base with small LEFT pleural effusion. MUSCULOSKELETAL: Visualized soft tissues and included osseous structures appear normal. Review of the MIP images confirms the above findings. CT ABDOMEN and PELVIS FINDINGS HEPATOBILIARY: Liver and gallbladder are normal. PANCREAS: Normal. SPLEEN: Normal. ADRENALS/URINARY TRACT: Kidneys are orthotopic, demonstrating symmetric enhancement. No nephrolithiasis, hydronephrosis or solid renal masses. Early excretion of contrast decreases sensitivity for small  nonobstructing calculi. The unopacified ureters are normal in course and caliber. Urinary bladder is partially distended and unremarkable. Normal adrenal glands. STOMACH/BOWEL: The stomach, small and large bowel are normal in course and caliber without inflammatory changes. Normal appendix. VASCULAR/LYMPHATIC: Aortoiliac vessels are normal in course and caliber. No lymphadenopathy by CT size criteria. REPRODUCTIVE: Normal. OTHER: No intraperitoneal free fluid or free air. MUSCULOSKELETAL: Nonacute.  Moderate L4-5 disc osteophyte complex. IMPRESSION: CTA CHEST: 1. Acute bilateral lower lobe nonocclusive segmental to subsegmental pulmonary emboli. CT evidence of right heart strain (RV/LV Ratio = 1) consistent with at least submassive (intermediate risk) PE. The presence of right heart strain has been associated with an increased risk of morbidity and mortality. Please activate Code PE by paging [phone]. 2. LEFT lower lobe pneumonia, probable RIGHT lower lobe pneumonia. Bibasilar atelectasis. Small LEFT pleural effusion. CT ABDOMEN AND PELVIS: 1. No acute intra-abdominal/pelvic disease. Critical Value/emergent results were called by telephone at the time of interpretation on 09/14/2018 at 4:06 am to Dr. Drema Pry , who verbally acknowledged these results. Electronically Signed   By: Awilda Metro M.D.   On: 09/14/2018 04:07   Vas [place] Lower Extremity Venous (dvt)  Result Date: 09/14/2018  Lower Venous Study Indications: Pulmonary embolism.  Performing Technologist: Blanch Media RVS  Examination Guidelines: A complete evaluation includes B-mode imaging, spectral Doppler, color Doppler, and power Doppler as needed of all accessible portions of each vessel. Bilateral testing is considered an integral part of a complete examination. Limited examinations for reoccurring indications may be performed as noted.  Right Venous Findings: +---------+---------------+---------+-----------+----------+--------------+           CompressibilityPhasicitySpontaneityPropertiesSummary        +---------+---------------+---------+-----------+----------+--------------+ CFV      Full           Yes      Yes                                 +---------+---------------+---------+-----------+----------+--------------+ SFJ      Full                                                        +---------+---------------+---------+-----------+----------+--------------+ FV Prox  Full                                                        +---------+---------------+---------+-----------+----------+--------------+ FV Mid   Full                                                        +---------+---------------+---------+-----------+----------+--------------+ FV DistalFull                                                        +---------+---------------+---------+-----------+----------+--------------+  PFV      Full                                                        +---------+---------------+---------+-----------+----------+--------------+ POP      Full           Yes      Yes                                 +---------+---------------+---------+-----------+----------+--------------+ PTV      Full                                                        +---------+---------------+---------+-----------+----------+--------------+ PERO                                                  Not visualized +---------+---------------+---------+-----------+----------+--------------+ Gastroc  None                                         Acute          +---------+---------------+---------+-----------+----------+--------------+  Left Venous Findings: +---------+---------------+---------+-----------+----------+-------+          CompressibilityPhasicitySpontaneityPropertiesSummary +---------+---------------+---------+-----------+----------+-------+ CFV      Full           Yes      Yes                           +---------+---------------+---------+-----------+----------+-------+ SFJ      Full                                                 +---------+---------------+---------+-----------+----------+-------+ FV Prox  Full                                                 +---------+---------------+---------+-----------+----------+-------+ FV Mid   Full                                                 +---------+---------------+---------+-----------+----------+-------+ FV DistalFull                                                 +---------+---------------+---------+-----------+----------+-------+ PFV      Full                                                 +---------+---------------+---------+-----------+----------+-------+  POP      Full           Yes      Yes                          +---------+---------------+---------+-----------+----------+-------+ PTV      Full                                                 +---------+---------------+---------+-----------+----------+-------+ PERO     Full                                                 +---------+---------------+---------+-----------+----------+-------+    Summary: Right: Findings consistent with acute deep vein thrombosis involving the right gastrocnemius vein. No cystic structure found in the popliteal fossa. Left: There is no evidence of deep vein thrombosis in the lower extremity. No cystic structure found in the popliteal fossa.  *See table(s) above for measurements and observations. Electronically signed by Fabienne Bruns MD on 09/14/2018 at 5:03:35 PM.    Final         Scheduled Meds: . azithromycin  500 mg Oral Q24H  . pneumococcal 23 valent vaccine  0.5 mL Intramuscular Tomorrow-1000   Continuous Infusions: . cefTRIAXone (ROCEPHIN)  IV Stopped (09/15/18 0239)  . heparin 2,000 Units/hr (09/15/18 0645)     LOS: 1 day    Time spent: 40 minutes   Jarry Manon, MD [facility] Pager [phone]  If 7PM-7AM, please contact night-coverage [email or web address] Password [email or web address] 09/15/2018, 3:50 PM

## 2018-09-16 DIAGNOSIS — I82401 Acute embolism and thrombosis of unspecified deep veins of right lower extremity: Secondary | ICD-10-CM

## 2018-09-16 DIAGNOSIS — R0781 Pleurodynia: Secondary | ICD-10-CM

## 2018-09-16 LAB — CBC
HCT: 39.6 % (ref 39.0–52.0)
Hemoglobin: 12.6 g/dL — ABNORMAL LOW (ref 13.0–17.0)
MCH: 25.9 pg — ABNORMAL LOW (ref 26.0–34.0)
MCHC: 31.8 g/dL (ref 30.0–36.0)
MCV: 81.5 fL (ref 80.0–100.0)
NRBC: 0 % (ref 0.0–0.2)
Platelets: 339 10*3/uL (ref 150–400)
RBC: 4.86 MIL/uL (ref 4.22–5.81)
RDW: 13.1 % (ref 11.5–15.5)
WBC: 7.7 10*3/uL (ref 4.0–10.5)

## 2018-09-16 LAB — HEPARIN LEVEL (UNFRACTIONATED): Heparin Unfractionated: 0.42 IU/mL (ref 0.30–0.70)

## 2018-09-16 MED ORDER — PREDNISONE 20 MG PO TABS
20.0000 mg | ORAL_TABLET | Freq: Every day | ORAL | Status: DC
  Administered 2018-09-16 – 2018-09-17 (×2): 20 mg via ORAL
  Filled 2018-09-16 (×2): qty 1

## 2018-09-16 NOTE — TOC Initial Note (Signed)
Transition of Care [facility]) - Initial/Assessment Note    Patient Details  Name: Johnny Ramsey MRN: [id number] Date of Birth: [birth year]  Transition of Care [facility]) CM/SW Contact:    Gala Lewandowsky, RN Phone Number: 09/16/2018, 3:02 PM  Clinical Narrative:   Pt presented for flank pain- Positive for pulmonary embolus and DVT. 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 Pt does not have  PCP- CM did make patient aware that he can call [phone] number on back of insurance card to find a PCP in network. Benefits check submitted for Xarelto. CM will continue to monitor for additional transition of care needs.               Expected Discharge Plan: Home/Self Care Barriers to Discharge: No Barriers Identified   Patient Goals and CMS Choice   CMS Medicare.gov Compare Post Acute Care list provided to:: (N/A)    Expected Discharge Plan and Services Expected Discharge Plan: Home/Self Care Discharge Planning Services: CM Consult, Medication Assistance Post Acute Care Choice: NA                   DME Arranged: N/A DME Agency: NA HH Arranged: NA HH Agency: NA  Prior Living Arrangements/Services   Lives with:: Self Patient language and need for interpreter reviewed:: No Do you feel safe going back to the place where you live?: Yes      Need for Family Participation in Patient Care: No (Comment) Care giver support system in place?: No (comment) Current home services: Other (comment)(N/A) Criminal Activity/Legal Involvement Pertinent to Current Situation/Hospitalization: No - Comment as needed  Activities of Daily Living Home Assistive Devices/Equipment: None ADL Screening (condition at time of admission) Patient's cognitive ability adequate to safely complete daily activities?: Yes Is the patient deaf or have difficulty hearing?: No Does the patient have difficulty seeing, even when wearing glasses/contacts?: No Does the patient have  difficulty concentrating, remembering, or making decisions?: No Patient able to express need for assistance with ADLs?: Yes Does the patient have difficulty dressing or bathing?: No Independently performs ADLs?: Yes (appropriate for developmental age) Does the patient have difficulty walking or climbing stairs?: No Weakness of Legs: None Weakness of Arms/Hands: None  Permission Sought/Granted Permission sought to share information with : Case Manager                Emotional Assessment Appearance:: Developmentally appropriate Attitude/Demeanor/Rapport: Engaged Affect (typically observed): Accepting Orientation: : Oriented to Self, Oriented to Place, Oriented to  Time, Oriented to Situation Alcohol / Substance Use: Not Applicable Psych Involvement: No (comment)  Admission diagnosis:  Pleurisy [R09.1] Other acute pulmonary embolism with acute cor pulmonale (HCC) [I26.09] Community acquired pneumonia of left lower lobe of lung (HCC) [J18.1] Patient Active Problem List   Diagnosis Date Noted  . Bilateral pulmonary embolism (HCC) 09/14/2018  . Community acquired pneumonia of both lower lobes (HCC) 09/14/2018   PCP:  Patient, No Pcp Per Pharmacy:   [id number] [place], [place] - [address] [place] [address] Phone: [phone] Fax: [phone]     Social Determinants of Health (SDOH) Interventions    Readmission Risk Interventions 30 Day Unplanned Readmission Risk Score     ED to Hosp-Admission (Current) from 09/13/2018 in [place] [facility]  30 Day Unplanned Readmission Risk Score (%)  8 Filed at 09/16/2018 1200     This score is  the patient's risk of an unplanned readmission within 30 days of being discharged (0 -100%). The score is based on dignosis, age, lab data, medications, orders, and past utilization.   Low:  0-14.9   Medium: 15-21.9   High: 22-29.9   Extreme: 30 and above       No  flowsheet data found.

## 2018-09-16 NOTE — Progress Notes (Signed)
PROGRESS NOTE    Johnny Ramsey  ZOX:[id number] DOB: 06/21/1991 DOA: 09/13/2018 PCP: Patient, No Pcp Per    Brief Narrative:  Johnny Ramsey is a 28 y.o. 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  Was seen in small ED in [place] for L flank pain.  Diagnosed with small renal stone, and told that they incidentally saw evidence of LLL PNA on the CT scan.  Given script for levoquin which he didn't fill.  Flank pain has been fluctuating in nature, but gradually worsened since onset. He continues to have no cough, no congestion, no SOB.  Pain is worse with deep breathing.  No PMH of blood clots, no lower extremity swelling or pain.  Work-up in emergency department D. Dimer was elevated at 3.2.  UA shows mod HGB, neg LE, nitrites, or WBCs.  Tm 100.9.  HR 109. WBC 10.8k. CTA chest reveals B PEs with R heart strain.  LLL PNA, and probable RLL PNA. Started on heparin gtt, rocephin, and azithro. Assessment & Plan:   Principal Problem:   Bilateral pulmonary embolism (HCC) Active Problems:   Community acquired pneumonia of both lower lobes (HCC)   ##Bilateral pulmonary embolism -CT angiogram showed cute bilateral lower lobe nonocclusive segmental to subsegmental pulmonary emboli with a CT evidence of right heart strain, consistent with submassive PE -Provoked from immobility being a truck driver -Heparin gtt -2d echo -BLE US-DVT involving the right gastrocnemius vein -Continue on telemetry -Good improvement  ##Left lower extremity DVT -Continue with heparin drip -Plan to discharge patient on Xarelto  ##Bilateral lower lobe pneumonia- -Influenza A and B are negative -Strep pneumo urinary-negative -Follow-up with blood cultures -negative to date -Continue with Rocephin, Zithromax -Covid-19 seems unlikely (no cough, no SOB, no URI symptoms, only seems to be affecting lower lobes of lungs, etc). -Improving   ##Pleuritic pain -Keep the patient on prednisone   DVT prophylaxis: Heparin drip Code Status: (Full Family Communication: Mother at bedside  disposition Plan: Charge patient home tomorrow   Procedures:  Venous Dopplers  Antimicrobials:  Rocephin 09/13/2018 Zithromax 09/13/2018  Subjective: Improved cough, shortness of breath.  Afebrile in the last 24 hours.  Complaining of mild pain taking deep breath Objective: Vitals:   09/15/18 0642 09/15/18 2124 09/16/18 0628 09/16/18 0631  BP:  118/77 117/75   Pulse:  72 (!) 58   Resp:      Temp:  98.9 F (37.2 C) 97.9 F (36.6 C)   TempSrc:      SpO2:  97% 96%   Weight: 112.2 kg   111.5 kg  Height:        Intake/Output Summary (Last 24 hours) at 09/16/2018 1324 Last data filed at 09/16/2018 0602 Gross per 24 hour  Intake 744.97 ml  Output 400 ml  Net 344.97 ml   Filed Weights   09/14/18 0111 09/15/18 0642 09/16/18 0631  Weight: 106.6 kg 112.2 kg 111.5 kg    Examination:  General exam: Appears calm and comfortable  Respiratory system: Clear to auscultation. Respiratory effort normal. Cardiovascular system: S1 & S2 heard, RRR. No JVD, murmurs, rubs, gallops or clicks. No pedal edema. Gastrointestinal system: Abdomen is nondistended, soft and nontender. No organomegaly or masses felt. Normal bowel sounds heard. Central nervous system: Alert and oriented. No focal neurological deficits. Extremities: Symmetric 5 x 5 power. Skin: No rashes, lesions or ulcers Psychiatry: Judgement and insight appear normal. Mood & affect appropriate.  Data Reviewed: I have personally reviewed following labs and imaging studies  CBC: Recent Labs  Lab 09/14/18 0002 09/15/18 0354 09/16/18 0408  WBC 10.8* 7.8 7.7  NEUTROABS 7.1  --   --   HGB 13.3 12.2* 12.6*  HCT 42.8 38.8* 39.6  MCV 82.5 82.0 81.5  PLT 278 284 339   Basic Metabolic Panel: Recent Labs  Lab 09/14/18 0002 09/15/18 0354  NA 138 137  K 3.9 3.9  CL 102 105  CO2  25 23  GLUCOSE 94 108*  BUN 10 8  CREATININE 1.16 1.00  CALCIUM 9.6 9.0   GFR: Estimated Creatinine Clearance: 144.4 mL/min (by C-G formula based on SCr of 1 mg/dL). Liver Function Tests: No results for input(s): AST, ALT, ALKPHOS, BILITOT, PROT, ALBUMIN in the last 168 hours. No results for input(s): LIPASE, AMYLASE in the last 168 hours. No results for input(s): AMMONIA in the last 168 hours. Coagulation Profile: No results for input(s): INR, PROTIME in the last 168 hours. Cardiac Enzymes: No results for input(s): CKTOTAL, CKMB, CKMBINDEX, TROPONINI in the last 168 hours. BNP (last 3 results) No results for input(s): PROBNP in the last 8760 hours. HbA1C: No results for input(s): HGBA1C in the last 72 hours. CBG: No results for input(s): GLUCAP in the last 168 hours. Lipid Profile: No results for input(s): CHOL, HDL, LDLCALC, TRIG, CHOLHDL, LDLDIRECT in the last 72 hours. Thyroid Function Tests: No results for input(s): TSH, T4TOTAL, FREET4, T3FREE, THYROIDAB in the last 72 hours. Anemia Panel: No results for input(s): VITAMINB12, FOLATE, FERRITIN, TIBC, IRON, RETICCTPCT in the last 72 hours. Sepsis Labs: No results for input(s): PROCALCITON, LATICACIDVEN in the last 168 hours.  Recent Results (from the past 240 hour(s))  Culture, blood (routine x 2) Call MD if unable to obtain prior to antibiotics being given     Status: None (Preliminary result)   Collection Time: 09/14/18  5:25 AM  Result Value Ref Range Status   Specimen Description BLOOD RIGHT HAND  Final   Special Requests   Final    BOTTLES DRAWN AEROBIC AND ANAEROBIC Blood Culture results may not be optimal due to an excessive volume of blood received in culture bottles PATIENT ON FOLLOWING ZITHROMAX,ROCEPHIN   Culture   Final    NO GROWTH 2 DAYS Performed at [facility], [address] [place], [place] [address]    Report Status PENDING  Incomplete  Culture, blood (routine x 2) Call MD if unable to obtain  prior to antibiotics being given     Status: None (Preliminary result)   Collection Time: 09/14/18  5:25 AM  Result Value Ref Range Status   Specimen Description BLOOD LEFT HAND  Final   Special Requests   Final    BOTTLES DRAWN AEROBIC ONLY Blood Culture adequate volume PATIENT ON FOLLOWING ZITHROMAX,ROCEPHIN   Culture   Final    NO GROWTH 2 DAYS Performed at [facility], [address] [place], [place] [address]    Report Status PENDING  Incomplete         Radiology Studies: No results found.      Scheduled Meds: . azithromycin  500 mg Oral Q24H   Continuous Infusions: . cefTRIAXone (ROCEPHIN)  IV 10 mL/hr at 09/16/18 0602  . heparin 2,000 Units/hr (09/16/18 0602)     LOS: 2 days    Time spent: 40 minutes   Daimen Shovlin, MD [facility] Pager [phone]  If 7PM-7AM, please contact night-coverage [email or web address] Password [facility] 09/16/2018,  1:24 PM

## 2018-09-16 NOTE — TOC Benefit Eligibility Note (Signed)
Transition of Care [facility]) Benefit Eligibility Note    Patient Details  Name: Johnny Ramsey MRN: [id number] Date of Birth: 1991-04-24   Medication/Dose: Alveda Reasons 15 MG BID        Prescription Coverage Preferred Pharmacy: [facility]  Spoke with Person/Company/Phone Number:: TURIYA  @ [employer] [place] # [phone]  Co-Pay: $ 150.00   Q/L TWO PILL PER DAY  Prior Approval: No     Additional Notes: XARELTO 20 MG DAILY  $ 144.25 Q/L ONE PILL PER DAY   OUTOF POCKET -NOT MET    Memory Argue Phone Number: 09/16/2018, 4:10 PM

## 2018-09-16 NOTE — Progress Notes (Signed)
ANTICOAGULATION CONSULT NOTE  Pharmacy Consult for heparin Indication: pulmonary embolus  Patient Measurements: Height: 6' (182.9 cm) Weight: 245 lb 14.4 oz (111.5 kg) IBW/kg (Calculated) : 77.6 Heparin Dosing Weight: 100kg  Vital Signs: Temp: 97.9 F (36.6 C) (03/11 0628) BP: 117/75 (03/11 0628) Pulse Rate: 58 (03/11 0628)  Labs: Recent Labs    09/14/18 0002  09/14/18 2031 09/15/18 0354 09/16/18 0408  HGB 13.3  --   --  12.2* 12.6*  HCT 42.8  --   --  38.8* 39.6  PLT 278  --   --  284 339  HEPARINUNFRC  --    < > 0.31 0.43 0.42  CREATININE 1.16  --   --  1.00  --    < > = values in this interval not displayed.    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 Dopplers positive for acute DVT in R extremity. Heparin level is therapeutic. H/h plts wnl.   Goal of Therapy:  Heparin level 0.3-0.7 units/ml Monitor platelets by anticoagulation protocol: Yes   Plan:  -Continue heparin at 2000 units/hr -Daily HL, CBC -F/u plan for long-term anticoagulation   Baldemar Friday 09/16/2018 8:27 AM

## 2018-09-17 LAB — CBC
HCT: 40.6 % (ref 39.0–52.0)
Hemoglobin: 13.2 g/dL (ref 13.0–17.0)
MCH: 26.2 pg (ref 26.0–34.0)
MCHC: 32.5 g/dL (ref 30.0–36.0)
MCV: 80.7 fL (ref 80.0–100.0)
Platelets: 334 10*3/uL (ref 150–400)
RBC: 5.03 MIL/uL (ref 4.22–5.81)
RDW: 12.7 % (ref 11.5–15.5)
WBC: 9.4 10*3/uL (ref 4.0–10.5)
nRBC: 0 % (ref 0.0–0.2)

## 2018-09-17 LAB — HEPARIN LEVEL (UNFRACTIONATED): Heparin Unfractionated: 0.51 IU/mL (ref 0.30–0.70)

## 2018-09-17 MED ORDER — AMOXICILLIN-POT CLAVULANATE 875-125 MG PO TABS: 1.0000 | ORAL_TABLET | Freq: Two times a day (BID) | ORAL | 0 refills | Status: AC

## 2018-09-17 MED ORDER — AZITHROMYCIN 250 MG PO TABS: 250.0000 mg | ORAL_TABLET | Freq: Every day | ORAL | 0 refills | Status: AC

## 2018-09-17 MED ORDER — RIVAROXABAN 15 MG PO TABS
15.0000 mg | ORAL_TABLET | Freq: Two times a day (BID) | ORAL | Status: DC
  Administered 2018-09-17: 15 mg via ORAL
  Filled 2018-09-17: qty 1

## 2018-09-17 MED ORDER — RIVAROXABAN (XARELTO) VTE STARTER PACK (15 & 20 MG): ORAL_TABLET | ORAL | 4 refills | Status: DC

## 2018-09-17 MED ORDER — PREDNISONE 20 MG PO TABS: 20.0000 mg | ORAL_TABLET | Freq: Every day | ORAL | 0 refills | Status: AC

## 2018-09-17 NOTE — Plan of Care (Signed)
  Problem: Education: Goal: Knowledge of General Education information will improve Description Including pain rating scale, medication(s)/side effects and non-pharmacologic comfort measures Outcome: Progressing   

## 2018-09-17 NOTE — TOC Transition Note (Signed)
Transition of Care [facility]) - CM/SW Discharge Note   Patient Details  Name: Johnny Ramsey MRN: [id number] Date of Birth: [birth year]  Transition of [facility]) CM/SW Contact:  Gala Lewandowsky, RN Phone Number: 09/17/2018, 11:35 AM   Clinical Narrative:   Plan to transition home today with Xarelto- 30 day free and co pay card presented via Staff RN. No further home needs identified.     Final next level of care: Home/Self Care Barriers to Discharge: No Barriers Identified   Patient Goals and CMS Choice   CMS Medicare.gov Compare Post Acute Care list provided to:: (N/A) Choice offered to / list presented to : NA  Discharge Placement  Plan home with home health services.                      Discharge Plan and Services Discharge Planning Services: CM Consult, Medication Assistance Post Acute Care Choice: NA          DME Arranged: N/A DME Agency: NA HH Arranged: NA HH Agency: NA   Social Determinants of Health (SDOH) Interventions     Readmission Risk Interventions No flowsheet data found.

## 2018-09-17 NOTE — Progress Notes (Signed)
ANTICOAGULATION CONSULT NOTE  Pharmacy Consult for heparin Indication: pulmonary embolus  No Known Allergies  Patient Measurements: Height: 6' (182.9 cm) Weight: 243 lb 14.4 oz (110.6 kg) IBW/kg (Calculated) : 77.6 Heparin Dosing Weight: 100kg  Vital Signs: Temp: 98.2 F (36.8 C) (03/12 0709) BP: 112/76 (03/12 0709) Pulse Rate: 51 (03/12 0709)  Labs: Recent Labs    09/15/18 0354 09/16/18 0408 09/17/18 0539  HGB 12.2* 12.6* 13.2  HCT 38.8* 39.6 40.6  PLT 284 339 334  HEPARINUNFRC 0.43 0.42 0.51  CREATININE 1.00  --   --     Estimated Creatinine Clearance: 143.8 mL/min (by C-G formula based on SCr of 1 mg/dL).   Medical History: Past Medical History:  Diagnosis Date  . Kidney stone   . 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 Plans noted for Xarelto  -heparin level at goal -CBC stable  Goal of Therapy:  Heparin level 0.3-0.7 units/ml Monitor platelets by anticoagulation protocol: Yes   Plan:  Continue heparin at 2000 units/hr Monitor daily heparin level and CBC Will follow plans to change to Xarelto  Harland German, PharmD Clinical Pharmacist **Pharmacist phone directory can now be found on [email or web address] ([email or web address]).  Listed under [facility].

## 2018-09-17 NOTE — Discharge Summary (Signed)
Physician Discharge Summary  Johnny Ramsey BXI:[id number] DOB: 1991-06-23 DOA: 09/13/2018  PCP: Patient, No Pcp Per  Admit date: 09/13/2018 Discharge date: 09/17/2018  Time spent: 40 minutes  Recommendations for Outpatient Follow-up:  1. Follow-up with primary care physician in 1 week 2.    Discharge Diagnoses:  Principal Problem:   Bilateral pulmonary embolism (HCC) Active Problems:   Community acquired pneumonia of both lower lobes [facility])   Discharge Condition: Stable  Diet recommendation: Regular  Filed Weights   09/15/18 0642 09/16/18 0631 09/17/18 0709  Weight: 112.2 kg 111.5 kg 110.6 kg    History of present illness:  Johnny Ramsey a 28 y.o.malewith medical history significant ofpreviously healthy, who is a Naval architect,  drove back from [place]. Was seen in small ED in [place] for L flank pain. Diagnosed with small renal stone, and told that they incidentally saw evidence of LLL PNA on the CT scan. Given script for levoquin which he didn't fill. Flank pain has been fluctuating in nature, but gradually worsened since onset. He continues to have no cough, no congestion, no SOB. Pain is worse with deep breathing. No PMH of blood clots, no lower extremity swelling or pain.  Work-up in emergency departmentD. Dimer was elevated at 3.2. UA shows mod HGB, neg LE, nitrites, or WBCs. Tm 100.9. HR 109. WBC 10.8k. CTA chest reveals B PEs with R heart strain. LLL PNA, and probable RLL PNA. Started on heparin gtt, rocephin, and azithro.  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  No increased thickness of the right ventricular wall -BLE US-DVT involving the right gastrocnemius  vein -Patient is discharged on Xarelto.  Recommended patient to follow-up with primary care physician in 1 week  ##Left lower extremity DVT -Continue with heparin drip - discharge patient on Xarelto  ##Bilateral lower lobe pneumonia- -Influenza A and B are negative -Strep pneumo urinary-negative -Follow-up with blood cultures -negative to date -Continue with Rocephin, Zithromax -Covid-19 seems unlikely (no cough, no SOB, no URI symptoms, only seems to be affecting lower lobes of lungs, etc). -I discharged with Augmentin, Zithromax.  Recommended patient to follow-up with primary care physician in 1 week  ##Pleuritic pain -Keep the patient on prednisone with good improvement.  Procedures:  Echocardiogram   Discharge Exam: Vitals:   09/17/18 0709 09/17/18 1153  BP: 112/76 (!) 123/94  Pulse: (!) 51 (!) 51  Resp:  20  Temp: 98.2 F (36.8 C) 98.4 F (36.9 C)  SpO2: 97% 97%    General exam: Appears calm and comfortable  Respiratory system: Clear to auscultation. Respiratory effort normal. Cardiovascular system: S1 & S2 heard, RRR. No JVD, murmurs, rubs, gallops or clicks. No pedal edema. Gastrointestinal system: Abdomen is nondistended, soft and nontender. No organomegaly or masses felt. Normal bowel sounds heard. Central nervous system: Alert and oriented. No focal neurological deficits. Extremities: Symmetric 5 x 5 power. Skin: No rashes, lesions or ulcers Psychiatry: Judgement and insight appear normal. Mood & affect appropriate.   Discharge Instructions   Discharge Instructions    Diet - low sodium heart healthy   Complete by:  As directed    Discharge instructions   Complete by:  As directed    Follow-up with primary care physician in 1 week Return to  emergency department if developed worsening of the shortness of breath, excessive bleeding Cautious dealing with sharp objects, blood thinners increased risk of bleeding   Increase activity slowly   Complete by:   As directed      Allergies as of 09/17/2018   No Known Allergies     Medication List    STOP taking these medications   methocarbamol 500 MG tablet Commonly known as:  ROBAXIN     TAKE these medications   amoxicillin-clavulanate 875-125 MG tablet Commonly known as:  Augmentin Take 1 tablet by mouth 2 (two) times daily for 7 days.   azithromycin 250 MG tablet Commonly known as:  ZITHROMAX Take 1 tablet (250 mg total) by mouth daily for 5 days. Notes to patient:  Last dose was 3/12 AM   predniSONE 20 MG tablet Commonly known as:  DELTASONE Take 1 tablet (20 mg total) by mouth daily with breakfast. Start taking on:  September 18, 2018 What changed:    how much to take  when to take this   Rivaroxaban 15 & 20 MG Tbpk Take as directed on package: Start with one 15mg  tablet by mouth twice a day with food. On Day 22, switch to one 20mg  tablet once a day with food.      No Known Allergies    The results of significant diagnostics from this hospitalization (including imaging, microbiology, ancillary and laboratory) are listed below for reference.    Significant Diagnostic Studies: Dg Chest 2 View  Result Date: 09/14/2018 CLINICAL DATA:  Left-sided kidney stone and pneumonia diagnosis. Recent return from [place]. EXAM: CHEST - 2 VIEW COMPARISON:  None. FINDINGS: Shallow inspiration. Infiltration or atelectasis in both lung bases, greater on the left. Blunting of the left costophrenic angle consistent with fluid or pleural thickening. No pneumothorax. Heart size and pulmonary vascularity are normal. Mediastinal contours appear intact. IMPRESSION: Shallow inspiration with infiltration or atelectasis in both lung bases. Fluid or thickened pleura in the left costophrenic angle. Electronically Signed   By: Burman Nieves M.D.   On: 09/14/2018 00:33   Ct Angio Chest Pe W And/or Wo Contrast  Result Date: 09/14/2018 CLINICAL DATA:  Shortness of breath, chest and flank pain. Positive  D-dimer. History of kidney stones. EXAM: CT ANGIOGRAPHY CHEST CT ABDOMEN AND PELVIS WITH CONTRAST TECHNIQUE: Multidetector CT imaging of the chest was performed using the standard protocol during bolus administration of intravenous contrast. Multiplanar CT image reconstructions and MIPs were obtained to evaluate the vascular anatomy. Multidetector CT imaging of the abdomen and pelvis was performed using the standard protocol during bolus administration of intravenous contrast. CONTRAST:  <See Chart> ISOVUE-370 IOPAMIDOL (ISOVUE-370) INJECTION 76% COMPARISON:  None. FINDINGS: CTA CHEST FINDINGS CARDIOVASCULAR: Adequate contrast opacification of the pulmonary artery's. Main pulmonary artery is not enlarged. Nonocclusive bilateral lower lobe segmental to subsegmental pulmonary emboli. Heart size is normal, RIGHT heart strain (RV/LV equals 1). No pericardial effusion. Thoracic aorta is normal course and caliber, unremarkable. MEDIASTINUM/NODES: No lymphadenopathy by CT size criteria. LUNGS/PLEURA: Tracheobronchial tree is patent, no pneumothorax. Patchy consolidation LEFT > RIGHT lung base with small LEFT pleural effusion. MUSCULOSKELETAL: Visualized soft tissues and included osseous structures appear normal. Review of the MIP images confirms the above findings. CT ABDOMEN and PELVIS FINDINGS HEPATOBILIARY: Liver and gallbladder are normal. PANCREAS: Normal. SPLEEN: Normal. ADRENALS/URINARY TRACT: Kidneys are orthotopic, demonstrating symmetric enhancement. No nephrolithiasis, hydronephrosis or solid renal masses. Early excretion of contrast decreases sensitivity for small nonobstructing calculi. The unopacified ureters are normal in course  and caliber. Urinary bladder is partially distended and unremarkable. Normal adrenal glands. STOMACH/BOWEL: The stomach, small and large bowel are normal in course and caliber without inflammatory changes. Normal appendix. VASCULAR/LYMPHATIC: Aortoiliac vessels are normal in course  and caliber. No lymphadenopathy by CT size criteria. REPRODUCTIVE: Normal. OTHER: No intraperitoneal free fluid or free air. MUSCULOSKELETAL: Nonacute.  Moderate L4-5 disc osteophyte complex. IMPRESSION: CTA CHEST: 1. Acute bilateral lower lobe nonocclusive segmental to subsegmental pulmonary emboli. CT evidence of right heart strain (RV/LV Ratio = 1) consistent with at least submassive (intermediate risk) PE. The presence of right heart strain has been associated with an increased risk of morbidity and mortality. Please activate Code PE by paging [phone]. 2. LEFT lower lobe pneumonia, probable RIGHT lower lobe pneumonia. Bibasilar atelectasis. Small LEFT pleural effusion. CT ABDOMEN AND PELVIS: 1. No acute intra-abdominal/pelvic disease. Critical Value/emergent results were called by telephone at the time of interpretation on 09/14/2018 at 4:06 am to Dr. Drema Pry , who verbally acknowledged these results. Electronically Signed   By: Awilda Metro M.D.   On: 09/14/2018 04:07   Ct Abdomen Pelvis W Contrast  Result Date: 09/14/2018 CLINICAL DATA:  Shortness of breath, chest and flank pain. Positive D-dimer. History of kidney stones. EXAM: CT ANGIOGRAPHY CHEST CT ABDOMEN AND PELVIS WITH CONTRAST TECHNIQUE: Multidetector CT imaging of the chest was performed using the standard protocol during bolus administration of intravenous contrast. Multiplanar CT image reconstructions and MIPs were obtained to evaluate the vascular anatomy. Multidetector CT imaging of the abdomen and pelvis was performed using the standard protocol during bolus administration of intravenous contrast. CONTRAST:  <See Chart> ISOVUE-370 IOPAMIDOL (ISOVUE-370) INJECTION 76% COMPARISON:  None. FINDINGS: CTA CHEST FINDINGS CARDIOVASCULAR: Adequate contrast opacification of the pulmonary artery's. Main pulmonary artery is not enlarged. Nonocclusive bilateral lower lobe segmental to subsegmental pulmonary emboli. Heart size is normal, RIGHT  heart strain (RV/LV equals 1). No pericardial effusion. Thoracic aorta is normal course and caliber, unremarkable. MEDIASTINUM/NODES: No lymphadenopathy by CT size criteria. LUNGS/PLEURA: Tracheobronchial tree is patent, no pneumothorax. Patchy consolidation LEFT > RIGHT lung base with small LEFT pleural effusion. MUSCULOSKELETAL: Visualized soft tissues and included osseous structures appear normal. Review of the MIP images confirms the above findings. CT ABDOMEN and PELVIS FINDINGS HEPATOBILIARY: Liver and gallbladder are normal. PANCREAS: Normal. SPLEEN: Normal. ADRENALS/URINARY TRACT: Kidneys are orthotopic, demonstrating symmetric enhancement. No nephrolithiasis, hydronephrosis or solid renal masses. Early excretion of contrast decreases sensitivity for small nonobstructing calculi. The unopacified ureters are normal in course and caliber. Urinary bladder is partially distended and unremarkable. Normal adrenal glands. STOMACH/BOWEL: The stomach, small and large bowel are normal in course and caliber without inflammatory changes. Normal appendix. VASCULAR/LYMPHATIC: Aortoiliac vessels are normal in course and caliber. No lymphadenopathy by CT size criteria. REPRODUCTIVE: Normal. OTHER: No intraperitoneal free fluid or free air. MUSCULOSKELETAL: Nonacute.  Moderate L4-5 disc osteophyte complex. IMPRESSION: CTA CHEST: 1. Acute bilateral lower lobe nonocclusive segmental to subsegmental pulmonary emboli. CT evidence of right heart strain (RV/LV Ratio = 1) consistent with at least submassive (intermediate risk) PE. The presence of right heart strain has been associated with an increased risk of morbidity and mortality. Please activate Code PE by paging [phone]. 2. LEFT lower lobe pneumonia, probable RIGHT lower lobe pneumonia. Bibasilar atelectasis. Small LEFT pleural effusion. CT ABDOMEN AND PELVIS: 1. No acute intra-abdominal/pelvic disease. Critical Value/emergent results were called by telephone at the time  of interpretation on 09/14/2018 at 4:06 am to Dr. Drema Pry , who verbally acknowledged these results.  Electronically Signed   By: Awilda Metro M.D.   On: 09/14/2018 04:07   Vas [place] Lower Extremity Venous (dvt)  Result Date: 09/14/2018  Lower Venous Study Indications: Pulmonary embolism.  Performing Technologist: Blanch Media RVS  Examination Guidelines: A complete evaluation includes B-mode imaging, spectral Doppler, color Doppler, and power Doppler as needed of all accessible portions of each vessel. Bilateral testing is considered an integral part of a complete examination. Limited examinations for reoccurring indications may be performed as noted.  Right Venous Findings: +---------+---------------+---------+-----------+----------+--------------+          CompressibilityPhasicitySpontaneityPropertiesSummary        +---------+---------------+---------+-----------+----------+--------------+ CFV      Full           Yes      Yes                                 +---------+---------------+---------+-----------+----------+--------------+ SFJ      Full                                                        +---------+---------------+---------+-----------+----------+--------------+ FV Prox  Full                                                        +---------+---------------+---------+-----------+----------+--------------+ FV Mid   Full                                                        +---------+---------------+---------+-----------+----------+--------------+ FV DistalFull                                                        +---------+---------------+---------+-----------+----------+--------------+ PFV      Full                                                        +---------+---------------+---------+-----------+----------+--------------+ POP      Full           Yes      Yes                                  +---------+---------------+---------+-----------+----------+--------------+ PTV      Full                                                        +---------+---------------+---------+-----------+----------+--------------+ PERO  Not visualized +---------+---------------+---------+-----------+----------+--------------+ Gastroc  None                                         Acute          +---------+---------------+---------+-----------+----------+--------------+  Left Venous Findings: +---------+---------------+---------+-----------+----------+-------+          CompressibilityPhasicitySpontaneityPropertiesSummary +---------+---------------+---------+-----------+----------+-------+ CFV      Full           Yes      Yes                          +---------+---------------+---------+-----------+----------+-------+ SFJ      Full                                                 +---------+---------------+---------+-----------+----------+-------+ FV Prox  Full                                                 +---------+---------------+---------+-----------+----------+-------+ FV Mid   Full                                                 +---------+---------------+---------+-----------+----------+-------+ FV DistalFull                                                 +---------+---------------+---------+-----------+----------+-------+ PFV      Full                                                 +---------+---------------+---------+-----------+----------+-------+ POP      Full           Yes      Yes                          +---------+---------------+---------+-----------+----------+-------+ PTV      Full                                                 +---------+---------------+---------+-----------+----------+-------+ PERO     Full                                                  +---------+---------------+---------+-----------+----------+-------+    Summary: Right: Findings consistent with acute deep vein thrombosis involving the right gastrocnemius vein. No cystic structure found in the popliteal fossa. Left: There is no evidence of deep vein thrombosis in the lower extremity. No cystic structure found in the popliteal  fossa.  *See table(s) above for measurements and observations. Electronically signed by Fabienne Bruns MD on 09/14/2018 at 5:03:35 PM.    Final     Microbiology: Recent Results (from the past 240 hour(s))  Culture, blood (routine x 2) Call MD if unable to obtain prior to antibiotics being given     Status: None (Preliminary result)   Collection Time: 09/14/18  5:25 AM  Result Value Ref Range Status   Specimen Description BLOOD RIGHT HAND  Final   Special Requests   Final    BOTTLES DRAWN AEROBIC AND ANAEROBIC Blood Culture results may not be optimal due to an excessive volume of blood received in culture bottles PATIENT ON FOLLOWING ZITHROMAX,ROCEPHIN   Culture   Final    NO GROWTH 3 DAYS Performed at [facility], [address] [place], [place] [address]    Report Status PENDING  Incomplete  Culture, blood (routine x 2) Call MD if unable to obtain prior to antibiotics being given     Status: None (Preliminary result)   Collection Time: 09/14/18  5:25 AM  Result Value Ref Range Status   Specimen Description BLOOD LEFT HAND  Final   Special Requests   Final    BOTTLES DRAWN AEROBIC ONLY Blood Culture adequate volume PATIENT ON FOLLOWING ZITHROMAX,ROCEPHIN   Culture   Final    NO GROWTH 3 DAYS Performed at [facility], [address], [place], [place] [address]    Report Status PENDING  Incomplete     Labs: Basic Metabolic Panel: Recent Labs  Lab 09/14/18 0002 09/15/18 0354  NA 138 137  K 3.9 3.9  CL 102 105  CO2 25 23  GLUCOSE 94 108*  BUN 10 8  CREATININE 1.16 1.00  CALCIUM 9.6 9.0   Liver Function Tests: No results  for input(s): AST, ALT, ALKPHOS, BILITOT, PROT, ALBUMIN in the last 168 hours. No results for input(s): LIPASE, AMYLASE in the last 168 hours. No results for input(s): AMMONIA in the last 168 hours. CBC: Recent Labs  Lab 09/14/18 0002 09/15/18 0354 09/16/18 0408 09/17/18 0539  WBC 10.8* 7.8 7.7 9.4  NEUTROABS 7.1  --   --   --   HGB 13.3 12.2* 12.6* 13.2  HCT 42.8 38.8* 39.6 40.6  MCV 82.5 82.0 81.5 80.7  PLT 278 284 339 334   Cardiac Enzymes: No results for input(s): CKTOTAL, CKMB, CKMBINDEX, TROPONINI in the last 168 hours. BNP: BNP (last 3 results) No results for input(s): BNP in the last 8760 hours.  ProBNP (last 3 results) No results for input(s): PROBNP in the last 8760 hours.  CBG: No results for input(s): GLUCAP in the last 168 hours.     SignedSusa Griffins MD.  [facility] 09/17/2018, 2:39 PM

## 2018-09-19 LAB — CULTURE, BLOOD (ROUTINE X 2)
Culture: NO GROWTH
Culture: NO GROWTH

## 2018-10-11 NOTE — Plan of Care (Signed)
Received a call from RN- Patient was discharged  o 09/17/2018 with Xarelto prescription and is not able to see PCP.  Script for Xarelto 20mg  daily called into [facility] pharmacy.

## 2018-12-29 ENCOUNTER — Other Ambulatory Visit: Payer: Self-pay | Admitting: *Deleted

## 2018-12-29 MED ORDER — XARELTO 20 MG PO TABS: 20.0000 mg | ORAL_TABLET | Freq: Every day | ORAL | 0 refills | Status: DC

## 2018-12-30 ENCOUNTER — Ambulatory Visit: Payer: PRIVATE HEALTH INSURANCE | Admitting: Internal Medicine

## 2018-12-31 ENCOUNTER — Ambulatory Visit (INDEPENDENT_AMBULATORY_CARE_PROVIDER_SITE_OTHER): Payer: PRIVATE HEALTH INSURANCE | Admitting: Internal Medicine

## 2018-12-31 ENCOUNTER — Encounter: Payer: Self-pay | Admitting: Internal Medicine

## 2018-12-31 ENCOUNTER — Other Ambulatory Visit: Payer: Self-pay

## 2018-12-31 DIAGNOSIS — F17210 Nicotine dependence, cigarettes, uncomplicated: Secondary | ICD-10-CM

## 2018-12-31 DIAGNOSIS — Z720 Tobacco use: Secondary | ICD-10-CM

## 2018-12-31 DIAGNOSIS — I2699 Other pulmonary embolism without acute cor pulmonale: Secondary | ICD-10-CM

## 2018-12-31 NOTE — Progress Notes (Signed)
Virtual Visit via Telephone Note  I connected with Johnny Ramsey on 12/31/18 at  2:00 PM EDT by telephone and verified that I am speaking with the correct person using two identifiers.   I discussed the limitations, risks, security and privacy concerns of performing an evaluation and management service by telephone and the availability of in person appointments. I also discussed with the patient that there may be a patient responsible charge related to this service. The patient expressed understanding and agreed to proceed.  Location patient: home Location provider: work office Participants present for the call: patient, provider Patient did not have a visit in the prior 7 days to address this/these issue(s).   History of Present Illness:  Mainly scheduled this visit because he needs medication refills. He was hospitalized in March with a submassive PE with right heart strain and discharged home on Xarelto. He ran out on Tuesday. Called the hospital and they would not refill it for him. He has been doing well since DC. No CP or SOB. He is a smoker of about 3/4 pack per day for 2 years, occasional ETOH. He has no allergies, no family history of significance that he is aware of. He has no acute complaints today.   Observations/Objective: Patient sounds cheerful and well on the phone. I do not appreciate any increased work of breathing. Speech and thought processing are grossly intact. Patient reported vitals: none reported   Current Outpatient Medications:  .  predniSONE (DELTASONE) 20 MG tablet, Take 1 tablet (20 mg total) by mouth daily with breakfast., Disp: 5 tablet, Rfl: 0 .  XARELTO 20 MG TABS tablet, Take 1 tablet (20 mg total) by mouth daily with supper., Disp: 30 tablet, Rfl: 0  Review of Systems:  Constitutional: Denies fever, chills, diaphoresis, appetite change and fatigue.  HEENT: Denies photophobia, eye pain, redness, hearing loss, ear pain, congestion, sore  throat, rhinorrhea, sneezing, mouth sores, trouble swallowing, neck pain, neck stiffness and tinnitus.   Respiratory: Denies SOB, DOE, cough, chest tightness,  and wheezing.   Cardiovascular: Denies chest pain, palpitations and leg swelling.  Gastrointestinal: Denies nausea, vomiting, abdominal pain, diarrhea, constipation, blood in stool and abdominal distention.  Genitourinary: Denies dysuria, urgency, frequency, hematuria, flank pain and difficulty urinating.  Endocrine: Denies: hot or cold intolerance, sweats, changes in hair or nails, polyuria, polydipsia. Musculoskeletal: Denies myalgias, back pain, joint swelling, arthralgias and gait problem.  Skin: Denies pallor, rash and wound.  Neurological: Denies dizziness, seizures, syncope, weakness, light-headedness, numbness and headaches.  Hematological: Denies adenopathy. Easy bruising, personal or family bleeding history  Psychiatric/Behavioral: Denies suicidal ideation, mood changes, confusion, nervousness, sleep disturbance and agitation   Assessment and Plan:  Bilateral pulmonary embolism ([place]) -He has been out of Xarelto for 2 days. -Have counseled on importance in not having a lapse in doses. -Refills will be sent in for 20 mg daily. -He has no symptoms. -He is a long distance truck driver. We have discussed frequent stops.  Tobacco abuse -I have discussed tobacco cessation with the patient.  I have counseled the patient regarding the negative impacts of continued tobacco use including but not limited to lung cancer, COPD, and cardiovascular disease.  I have discussed alternatives to tobacco and modalities that may help facilitate tobacco cessation including but not limited to biofeedback, hypnosis, and medications.  Total time spent with tobacco counseling was 4 minutes. -Does not appear interested in quitting now, will continue to address at subsequent visits. -He has been counseled  on the significant thrombotic effects of smoking  in light of his recent massive PE.      I discussed the assessment and treatment plan with the patient. The patient was provided an opportunity to ask questions and all were answered. The patient agreed with the plan and demonstrated an understanding of the instructions.   The patient was advised to call back or seek an in-person evaluation if the symptoms worsen or if the condition fails to improve as anticipated.  I provided 24 minutes of non-face-to-face time during this encounter.   Chaya JanEstela Hernandez Acosta, MD [facility]

## 2019-01-25 ENCOUNTER — Other Ambulatory Visit: Payer: Self-pay | Admitting: Internal Medicine

## 2019-01-25 ENCOUNTER — Other Ambulatory Visit: Payer: Self-pay

## 2019-01-25 ENCOUNTER — Ambulatory Visit (INDEPENDENT_AMBULATORY_CARE_PROVIDER_SITE_OTHER): Payer: PRIVATE HEALTH INSURANCE | Admitting: Family Medicine

## 2019-01-25 ENCOUNTER — Encounter: Payer: Self-pay | Admitting: Family Medicine

## 2019-01-25 DIAGNOSIS — M62830 Muscle spasm of back: Secondary | ICD-10-CM

## 2019-01-25 DIAGNOSIS — I2699 Other pulmonary embolism without acute cor pulmonale: Secondary | ICD-10-CM

## 2019-01-25 MED ORDER — XARELTO 20 MG PO TABS: 20.0000 mg | ORAL_TABLET | Freq: Every day | ORAL | 2 refills | Status: DC

## 2019-01-25 MED ORDER — METHOCARBAMOL 500 MG PO TABS: 500.0000 mg | ORAL_TABLET | Freq: Three times a day (TID) | ORAL | 0 refills | Status: AC | PRN

## 2019-01-25 NOTE — Progress Notes (Signed)
Patient ID: Johnny Ramsey, male   DOB: 1990-10-21, 28 y.o.   MRN: [id number]   This visit type was conducted due to national recommendations for restrictions regarding the COVID-19 pandemic in an effort to limit this patient's exposure and mitigate transmission in our community.   Virtual Visit via Video Note  I connected with Sheppard Evens on 01/25/19 at  4:30 PM EDT by a video enabled telemedicine application and verified that I am speaking with the correct person using two identifiers.  Location patient: home Location provider:work or home office Persons participating in the virtual visit: patient, provider  I discussed the limitations of evaluation and management by telemedicine and the availability of in person appointments. The patient expressed understanding and agreed to proceed.   HPI: Patient has concerns for possible muscle spasms in his back.  His recent history is that he had bilateral pulmonary emboli back in March.  This was first event.  He is maintained currently on Xarelto 20 mg daily and compliant currently with use.  He works as a Administrator.  He states he has had several mornings recently where he has increased stiffness in muscles in his back region mostly lumbar.  He notices stiffness when he changes positions for several minutes after first getting up and then gradually improves.  Denies any recent injury.  No radiculitis symptoms.  No recent numbness or weakness.  He does continue to smoke and is aware of increased risk of coagulopathies with nicotine use   ROS: See pertinent positives and negatives per HPI.  Past Medical History:  Diagnosis Date  . Kidney stone   . Pneumonia   . Pulmonary embolism [facility])    March 2019: submassive with right heart strain    No past surgical history on file.  No family history on file.  SOCIAL HX: Smoking history as above   Current Outpatient Medications:  .  methocarbamol (ROBAXIN) 500 MG tablet, Take 1 tablet  (500 mg total) by mouth every 8 (eight) hours as needed for muscle spasms., Disp: 30 tablet, Rfl: 0 .  predniSONE (DELTASONE) 20 MG tablet, Take 1 tablet (20 mg total) by mouth daily with breakfast., Disp: 5 tablet, Rfl: 0 .  XARELTO 20 MG TABS tablet, Take 1 tablet (20 mg total) by mouth daily with supper., Disp: 30 tablet, Rfl: 2  EXAM:  VITALS per patient if applicable:  GENERAL: alert, oriented, appears well and in no acute distress  HEENT: atraumatic, conjunttiva clear, no obvious abnormalities on inspection of external nose and ears  NECK: normal movements of the head and neck  LUNGS: on inspection no signs of respiratory distress, breathing rate appears normal, no obvious gross SOB, gasping or wheezing  CV: no obvious cyanosis  MS: moves all visible extremities without noticeable abnormality  PSYCH/NEURO: pleasant and cooperative, no obvious depression or anxiety, speech and thought processing grossly intact  ASSESSMENT AND PLAN:  Discussed the following assessment and plan:  #1 musculoskeletal back pain -Discussed conservative things like heat and topical sports creams -Robaxin 500 mg nightly- cautioned about possible sedation. -Consider some stretches as well and consider physical therapy if not improving over the next couple of weeks  #2 history of bilateral pulmonary emboli -Patient was almost out of his Xarelto and we agreed to refill this and have encouraged close follow-up with his primary regarding guidance on duration of therapy     I discussed the assessment and treatment plan with the patient. The patient was provided an opportunity to  ask questions and all were answered. The patient agreed with the plan and demonstrated an understanding of the instructions.   The patient was advised to call back or seek an in-person evaluation if the symptoms worsen or if the condition fails to improve as anticipated.   Carolann Littler, MD

## 2019-01-27 ENCOUNTER — Ambulatory Visit: Payer: PRIVATE HEALTH INSURANCE | Admitting: Internal Medicine

## 2019-03-22 ENCOUNTER — Encounter (HOSPITAL_COMMUNITY): Payer: Self-pay | Admitting: Emergency Medicine

## 2019-03-22 ENCOUNTER — Emergency Department (HOSPITAL_COMMUNITY)
Admission: EM | Admit: 2019-03-22 | Discharge: 2019-03-22 | Disposition: A | Discharge status: Home / Self Care | Payer: PRIVATE HEALTH INSURANCE | Attending: Emergency Medicine | Admitting: Emergency Medicine

## 2019-03-22 ENCOUNTER — Other Ambulatory Visit: Payer: Self-pay

## 2019-03-22 DIAGNOSIS — Z041 Encounter for examination and observation following transport accident: Secondary | ICD-10-CM | POA: Diagnosis present

## 2019-03-22 DIAGNOSIS — Z79899 Other long term (current) drug therapy: Secondary | ICD-10-CM | POA: Diagnosis not present

## 2019-03-22 DIAGNOSIS — Y93I9 Activity, other involving external motion: Secondary | ICD-10-CM | POA: Diagnosis not present

## 2019-03-22 DIAGNOSIS — Y999 Unspecified external cause status: Secondary | ICD-10-CM | POA: Diagnosis not present

## 2019-03-22 DIAGNOSIS — F17210 Nicotine dependence, cigarettes, uncomplicated: Secondary | ICD-10-CM | POA: Diagnosis not present

## 2019-03-22 DIAGNOSIS — Z7901 Long term (current) use of anticoagulants: Secondary | ICD-10-CM | POA: Insufficient documentation

## 2019-03-22 DIAGNOSIS — Y92411 Interstate highway as the place of occurrence of the external cause: Secondary | ICD-10-CM | POA: Insufficient documentation

## 2019-03-22 DIAGNOSIS — Z0000 Encounter for general adult medical examination without abnormal findings: Secondary | ICD-10-CM
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
## 2019-03-22 NOTE — Discharge Instructions (Addendum)
It was our pleasure to provide your ER care today - we hope that you feel better.  Take acetaminophen or ibuprofen as need.  Return to ER if worse, new symptoms, new or severe pain, abdominal pain, trouble breathing, severe headache, numbness/weakness, or other concern.
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
## 2019-03-22 NOTE — ED Notes (Signed)
Patient verbalizes understanding of discharge instructions. Opportunity for questioning and answers were provided. Armband removed by staff, pt discharged from ED.  

## 2019-04-23 ENCOUNTER — Other Ambulatory Visit: Payer: Self-pay | Admitting: Family Medicine

## 2019-04-23 NOTE — Telephone Encounter (Signed)
Patient of Dr. Hernandez  

## 2019-05-09 ENCOUNTER — Encounter: Payer: Self-pay | Admitting: Internal Medicine

## 2019-05-09 ENCOUNTER — Other Ambulatory Visit: Payer: Self-pay | Admitting: Internal Medicine

## 2019-05-09 ENCOUNTER — Encounter: Payer: Self-pay | Admitting: Family Medicine

## 2019-12-29 ENCOUNTER — Telehealth: Payer: Self-pay | Admitting: Internal Medicine

## 2019-12-29 MED ORDER — XARELTO 20 MG PO TABS: 20.0000 mg | ORAL_TABLET | Freq: Every day | ORAL | 1 refills | Status: AC

## 2019-12-29 NOTE — Telephone Encounter (Signed)
Rx sent 

## 2019-12-29 NOTE — Telephone Encounter (Signed)
Pt is calling in stating that he is almost out of his Xarelto.  Pt has medication refill appointment on 01/12/2020 @ 3:30 and CPE appointment on 02/18/2020 @ 2:00 and will schedule fasting labs for the next day for this CPE visit.   Pharm:  [facility] [address]

## 2020-01-12 ENCOUNTER — Ambulatory Visit: Payer: Self-pay | Admitting: Internal Medicine

## 2020-01-12 DIAGNOSIS — Z0289 Encounter for other administrative examinations: Secondary | ICD-10-CM

## 2020-02-18 ENCOUNTER — Ambulatory Visit: Payer: PRIVATE HEALTH INSURANCE | Admitting: Internal Medicine

## 2020-02-18 DIAGNOSIS — Z0289 Encounter for other administrative examinations: Secondary | ICD-10-CM

## 2020-03-07 ENCOUNTER — Emergency Department (HOSPITAL_BASED_OUTPATIENT_CLINIC_OR_DEPARTMENT_OTHER): Payer: HRSA Program

## 2020-03-07 ENCOUNTER — Emergency Department (HOSPITAL_COMMUNITY)
Admission: EM | Admit: 2020-03-07 | Discharge: 2020-03-07 | Disposition: A | Discharge status: Home / Self Care | Payer: HRSA Program | Attending: Emergency Medicine | Admitting: Emergency Medicine

## 2020-03-07 ENCOUNTER — Emergency Department (HOSPITAL_COMMUNITY): Payer: HRSA Program

## 2020-03-07 ENCOUNTER — Encounter (HOSPITAL_COMMUNITY): Payer: Self-pay | Admitting: Emergency Medicine

## 2020-03-07 DIAGNOSIS — U071 COVID-19: Secondary | ICD-10-CM | POA: Insufficient documentation

## 2020-03-07 DIAGNOSIS — J1282 Pneumonia due to coronavirus disease 2019: Secondary | ICD-10-CM | POA: Insufficient documentation

## 2020-03-07 DIAGNOSIS — Z7901 Long term (current) use of anticoagulants: Secondary | ICD-10-CM | POA: Insufficient documentation

## 2020-03-07 DIAGNOSIS — F17210 Nicotine dependence, cigarettes, uncomplicated: Secondary | ICD-10-CM | POA: Insufficient documentation

## 2020-03-07 DIAGNOSIS — R52 Pain, unspecified: Secondary | ICD-10-CM

## 2020-03-07 DIAGNOSIS — R079 Chest pain, unspecified: Secondary | ICD-10-CM | POA: Diagnosis present

## 2020-03-07 DIAGNOSIS — M79606 Pain in leg, unspecified: Secondary | ICD-10-CM | POA: Insufficient documentation

## 2020-03-07 LAB — HEPATIC FUNCTION PANEL
ALT: 26 U/L (ref 0–44)
AST: 35 U/L (ref 15–41)
Albumin: 4.3 g/dL (ref 3.5–5.0)
Alkaline Phosphatase: 73 U/L (ref 38–126)
Bilirubin, Direct: 0.1 mg/dL (ref 0.0–0.2)
Indirect Bilirubin: 0.6 mg/dL (ref 0.3–0.9)
Total Bilirubin: 0.7 mg/dL (ref 0.3–1.2)
Total Protein: 8.2 g/dL — ABNORMAL HIGH (ref 6.5–8.1)

## 2020-03-07 LAB — BASIC METABOLIC PANEL
Anion gap: 12 (ref 5–15)
BUN: 9 mg/dL (ref 6–20)
CO2: 23 mmol/L (ref 22–32)
Calcium: 9.1 mg/dL (ref 8.9–10.3)
Chloride: 106 mmol/L (ref 98–111)
Creatinine, Ser: 0.99 mg/dL (ref 0.61–1.24)
GFR calc Af Amer: >60 mL/min (ref >60)
GFR calc non Af Amer: >60 mL/min (ref >60)
Glucose, Bld: 85 mg/dL (ref 70–99)
Potassium: 4 mmol/L (ref 3.5–5.1)
Sodium: 141 mmol/L (ref 135–145)

## 2020-03-07 LAB — I-STAT CHEM 8, ED
BUN: 9 mg/dL (ref 6–20)
Calcium, Ion: 1.01 mmol/L — ABNORMAL LOW (ref 1.15–1.40)
Chloride: 109 mmol/L (ref 98–111)
Creatinine, Ser: 0.9 mg/dL (ref 0.61–1.24)
Glucose, Bld: 85 mg/dL (ref 70–99)
HCT: 47 % (ref 39.0–52.0)
Hemoglobin: 16 g/dL (ref 13.0–17.0)
Potassium: 3.9 mmol/L (ref 3.5–5.1)
Sodium: 141 mmol/L (ref 135–145)
TCO2: 23 mmol/L (ref 22–32)

## 2020-03-07 LAB — CBC
HCT: 48.6 % (ref 39.0–52.0)
Hemoglobin: 14.8 g/dL (ref 13.0–17.0)
MCH: 26 pg (ref 26.0–34.0)
MCHC: 30.5 g/dL (ref 30.0–36.0)
MCV: 85.3 fL (ref 80.0–100.0)
Platelets: 155 10*3/uL (ref 150–400)
RBC: 5.7 MIL/uL (ref 4.22–5.81)
RDW: 13.9 % (ref 11.5–15.5)
WBC: 3.5 10*3/uL — ABNORMAL LOW (ref 4.0–10.5)
nRBC: 0 % (ref 0.0–0.2)

## 2020-03-07 LAB — TROPONIN I (HIGH SENSITIVITY): Troponin I (High Sensitivity): 4 ng/L (ref <18)

## 2020-03-07 LAB — SARS CORONAVIRUS 2 BY RT PCR (HOSPITAL ORDER, PERFORMED IN ~~LOC~~ HOSPITAL LAB): SARS Coronavirus 2: POSITIVE — AB

## 2020-03-07 MED ORDER — IOHEXOL 350 MG/ML SOLN
80.0000 mL | Freq: Once | INTRAVENOUS | Status: AC | PRN
  Administered 2020-03-07: 80 mL via INTRAVENOUS

## 2020-03-07 MED ORDER — ACETAMINOPHEN 500 MG PO TABS: 500.0000 mg | ORAL_TABLET | Freq: Four times a day (QID) | ORAL | 0 refills | Status: AC | PRN

## 2020-03-07 MED ORDER — ACETAMINOPHEN 500 MG PO TABS
1000.0000 mg | ORAL_TABLET | Freq: Once | ORAL | Status: AC
  Administered 2020-03-07: 1000 mg via ORAL
  Filled 2020-03-07: qty 2

## 2020-03-07 NOTE — Progress Notes (Signed)
Bilateral lower extremity venous duplex complete.  Please see CV Proc tab for preliminary results. Levin Bacon- RDMS, RVT 7:05 PM  03/07/2020

## 2020-03-07 NOTE — ED Notes (Signed)
Patient ambulated with pulse ox. Sats 100%

## 2020-03-07 NOTE — Discharge Instructions (Addendum)
Your Covid test today was positive.  There is no evidence of blood clots in the legs or lungs on work-up today.  You can take 1 to 2 tablets of extra strength Tylenol every 6 hours as needed for aches pains or fevers. Drink plenty of fluids and get rest.  Please quarantine.  Current CDC guidelines state to quarantine at home for at least 10 days since symptom onset and at least 24 hours fever free without the use of ibuprofen or Tylenol.  You did have a fever here.  I would not recommend that you stop quarantining until your symptoms improve and you have been fever free without any medications for at least 24 hours.  I would recommend that you quarantine away from your newborn son.  Please call the post Covid care center at [facility] first thing tomorrow morning to schedule follow-up appointment.  They will see Covid patients in person and can manage your symptoms and any long-term issues.  Return to the emergency department if any concerning signs or symptoms develop such as shortness of breath, severe pain, loss of consciousness, persistently elevated temperature.

## 2020-03-07 NOTE — ED Provider Notes (Signed)
[facility] EMERGENCY DEPARTMENT Provider Note   CSN: [id number] Arrival date & time: 03/07/20  1630     History Chief Complaint  Patient presents with  . Covid/ CP  . Leg Pain    Johnny Ramsey is a 29 y.o. 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  He states that the mother's child recently gave birth around 2 weeks ago and just prior to discharge from the [facility] she became hypoxic and was diagnosed with Covid.  He states that she is currently in the ICU but that he spent 2 days with her after she gave birth.  He denies shortness of breath, cough, fever, nasal congestion, sore throat, nausea, vomiting.  He reports generalized abdominal discomfort but denies urinary symptoms or diarrhea or constipation.  He notes sharp pains primarily along the left chest radiating to the right shoulder at times.  No aggravating or alleviating factors noted.  Also notes pain to the bilateral calfs.  He states the pain in his chest feels similar to when he had a PE.  He was on Xarelto for 1 year but stopped taking the medication several months ago when he switched insurance companies and could no longer afford the medication.  He is a current smoker.  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  He states that he takes frequent breaks throughout the day.  The history is provided by the patient.       Past Medical History:  Diagnosis Date  . Kidney stone   . Pneumonia   . Pulmonary embolism [facility])    March 2019: submassive with right heart strain    Patient Active Problem List   Diagnosis Date Noted  . Tobacco abuse 12/31/2018  . Bilateral pulmonary embolism (HCC) 09/14/2018  . Community acquired pneumonia of both lower lobes 09/14/2018    History reviewed. No pertinent surgical  history.     No family history on file.  Social History   Tobacco Use  . Smoking status: Current Every Day Smoker    Packs/day: 0.75    Years: 2.00    Pack years: 1.50    Types: Cigarettes  . Smokeless tobacco: Never Used  Substance Use Topics  . Alcohol use: Yes    Comment: occasional  . Drug use: Not on file    Home Medications Prior to Admission medications   Medication Sig Start Date End Date Taking? Authorizing Provider  acetaminophen (TYLENOL) 500 MG tablet Take 1 tablet (500 mg total) by mouth every 6 (six) hours as needed. 03/07/20   Jaleisa Brose A, PA-C  methocarbamol (ROBAXIN) 500 MG tablet Take 1 tablet (500 mg total) by mouth every 8 (eight) hours as needed for muscle spasms. 01/25/19   Burchette, Elberta Fortis, MD  predniSONE (DELTASONE) 20 MG tablet Take 1 tablet (20 mg total) by mouth daily with breakfast. 09/18/18   Vasireddy, Clerance Lav, MD  XARELTO 20 MG TABS tablet TAKE 1 TABLET (20 MG TOTAL) BY MOUTH DAILY WITH SUPPER. Patient not taking: Reported on 03/07/2020 01/27/19   Philip Aspen, Limmie Patricia, MD  XARELTO 20 MG TABS tablet Take 1 tablet (20 mg total) by mouth daily with supper. 12/29/19   Philip Aspen, Limmie Patricia, MD    Allergies    Patient has no known allergies.  Review of  Systems   Review of Systems  Constitutional: Negative for chills and fever.  Respiratory: Negative for cough and shortness of breath.   Cardiovascular: Positive for chest pain. Negative for leg swelling.  Gastrointestinal: Positive for abdominal pain. Negative for constipation, diarrhea, nausea and vomiting.  Musculoskeletal: Positive for arthralgias and myalgias.  All other systems reviewed and are negative.   Physical Exam Updated Vital Signs BP 127/81   Pulse 87   Temp 98.9 F (37.2 C) (Oral)   Resp 18   Ht 6' (1.829 m)   Wt 111.1 kg   SpO2 100%   BMI 33.23 kg/m   Physical Exam Vitals and nursing note reviewed.  Constitutional:      General: He is not in acute  distress.    Appearance: He is well-developed.  HENT:     Head: Normocephalic and atraumatic.  Eyes:     General:        Right eye: No discharge.        Left eye: No discharge.     Conjunctiva/sclera: Conjunctivae normal.  Neck:     Vascular: No JVD.     Trachea: No tracheal deviation.  Cardiovascular:     Rate and Rhythm: Normal rate and regular rhythm.  Pulmonary:     Effort: Pulmonary effort is normal.     Breath sounds: Normal breath sounds.  Chest:     Chest wall: Tenderness present.  Abdominal:     General: Bowel sounds are normal. There is no distension.     Palpations: Abdomen is soft.  Musculoskeletal:       Back:     Comments: No midline thoracic or lumbar spine tenderness.  Bilateral para thoracic/lumbar tenderness in the lower thoracic/upper lumbar region.  No deformity, crepitus, step-off, or ecchymosis noted.  Skin:    General: Skin is warm.     Findings: No erythema.  Neurological:     Mental Status: He is alert.  Psychiatric:        Behavior: Behavior normal.     ED Results / Procedures / Treatments   Labs (all labs ordered are listed, but only abnormal results are displayed) Labs Reviewed  SARS CORONAVIRUS 2 BY RT PCR (HOSPITAL ORDER, PERFORMED IN [facility]) - Abnormal; Notable for the following components:      Result Value   SARS Coronavirus 2 POSITIVE (*)    All other components within normal limits  CBC - Abnormal; Notable for the following components:   WBC 3.5 (*)    All other components within normal limits  HEPATIC FUNCTION PANEL - Abnormal; Notable for the following components:   Total Protein 8.2 (*)    All other components within normal limits  I-STAT CHEM 8, ED - Abnormal; Notable for the following components:   Calcium, Ion 1.01 (*)    All other components within normal limits  BASIC METABOLIC PANEL  TROPONIN I (HIGH SENSITIVITY)    EKG EKG Interpretation  Date/Time:  Tuesday March 07 2020 16:43:38  EDT Ventricular Rate:  87 PR Interval:  124 QRS Duration: 110 QT Interval:  360 QTC Calculation: 433 R Axis:   6 Text Interpretation: Normal sinus rhythm Normal ECG No significant change since prior 3/20 Confirmed by Meridee Score [phone]) on 03/07/2020 9:27:51 PM   Radiology CT Angio Chest PE W and/or Wo Contrast  Result Date: 03/07/2020 CLINICAL DATA:  P several months ago, COVID positive EXAM: CT ANGIOGRAPHY CHEST WITH CONTRAST TECHNIQUE: Multidetector CT imaging of the chest  was performed using the standard protocol during bolus administration of intravenous contrast. Multiplanar CT image reconstructions and MIPs were obtained to evaluate the vascular anatomy. CONTRAST:  19mL OMNIPAQUE IOHEXOL 350 MG/ML SOLN COMPARISON:  CT chest September 14, 2018 FINDINGS: Cardiovascular: There is a optimal opacification of the pulmonary arteries. There is no central,segmental, or subsegmental filling defects within the pulmonary arteries. The heart is normal in size. No pericardial effusion or thickening. No evidence right heart strain. There is normal three-vessel brachiocephalic anatomy without proximal stenosis. The thoracic aorta is normal in appearance. Mediastinum/Nodes: No hilar, mediastinal, or axillary adenopathy. Thyroid gland, trachea, and esophagus demonstrate no significant findings. Lungs/Pleura: Rounded patchy airspace consolidation seen at the anterior left lower lobe. There is also a small amount of subpleural patchy airspace opacity posteriorly at the left lung base. No pleural effusion or pneumothorax. Upper Abdomen: No acute abnormalities present in the visualized portions of the upper abdomen. Musculoskeletal: No chest wall abnormality. No acute or significant osseous findings. Review of the MIP images confirms the above findings. IMPRESSION: No central, segmental, or subsegmental pulmonary embolism Rounded patchy airspace consolidation at the left lower lung, likely consistent with atypical viral  pneumonia. Electronically Signed   By: Jonna Clark M.D.   On: 03/07/2020 22:29   VAS [place] LOWER EXTREMITY VENOUS (DVT) (ONLY [facility] & [facility])  Result Date: 03/07/2020  Lower Venous DVTStudy Indications: Pain, and Covid +.  Risk Factors: History of PE. Performing Technologist: Levada Schilling RDMS, RVT  Examination Guidelines: A complete evaluation includes B-mode imaging, spectral Doppler, color Doppler, and power Doppler as needed of all accessible portions of each vessel. Bilateral testing is considered an integral part of a complete examination. Limited examinations for reoccurring indications may be performed as noted. The reflux portion of the exam is performed with the patient in reverse Trendelenburg.  +---------+---------------+---------+-----------+----------+--------------+ RIGHT    CompressibilityPhasicitySpontaneityPropertiesThrombus Aging +---------+---------------+---------+-----------+----------+--------------+ CFV      Full           Yes      Yes                                 +---------+---------------+---------+-----------+----------+--------------+ SFJ      Full                                                        +---------+---------------+---------+-----------+----------+--------------+ FV Prox  Full                                                        +---------+---------------+---------+-----------+----------+--------------+ FV Mid   Full                                                        +---------+---------------+---------+-----------+----------+--------------+ FV DistalFull                                                        +---------+---------------+---------+-----------+----------+--------------+  PFV      Full                                                        +---------+---------------+---------+-----------+----------+--------------+ POP      Full           Yes      Yes                                  +---------+---------------+---------+-----------+----------+--------------+ PTV      Full                                                        +---------+---------------+---------+-----------+----------+--------------+ PERO     Full                                                        +---------+---------------+---------+-----------+----------+--------------+ GSV      Full                                                        +---------+---------------+---------+-----------+----------+--------------+   +---------+---------------+---------+-----------+----------+--------------+ LEFT     CompressibilityPhasicitySpontaneityPropertiesThrombus Aging +---------+---------------+---------+-----------+----------+--------------+ CFV      Full           Yes      Yes                                 +---------+---------------+---------+-----------+----------+--------------+ SFJ      Full                                                        +---------+---------------+---------+-----------+----------+--------------+ FV Prox  Full                                                        +---------+---------------+---------+-----------+----------+--------------+ FV Mid   Full                                                        +---------+---------------+---------+-----------+----------+--------------+ FV DistalFull                                                        +---------+---------------+---------+-----------+----------+--------------+  PFV      Full                                                        +---------+---------------+---------+-----------+----------+--------------+ POP      Full           Yes      Yes                                 +---------+---------------+---------+-----------+----------+--------------+ PTV      Full                                                         +---------+---------------+---------+-----------+----------+--------------+ PERO     Full                                                        +---------+---------------+---------+-----------+----------+--------------+ GSV      Full                                                        +---------+---------------+---------+-----------+----------+--------------+     Summary: RIGHT: - There is no evidence of deep vein thrombosis in the lower extremity.  - No cystic structure found in the popliteal fossa.  LEFT: - There is no evidence of deep vein thrombosis in the lower extremity.  - No cystic structure found in the popliteal fossa.  *See table(s) above for measurements and observations. Electronically signed by Coral Else MD on 03/07/2020 at 8:50:06 PM.    Final     Procedures Procedures (including critical care time)  Medications Ordered in ED Medications  acetaminophen (TYLENOL) tablet 1,000 mg (1,000 mg Oral Given 03/07/20 2243)  iohexol (OMNIPAQUE) 350 MG/ML injection 80 mL (80 mLs Intravenous Contrast Given 03/07/20 2218)    ED Course  I have reviewed the triage vital signs and the nursing notes.  Pertinent labs & imaging results that were available during my care of the patient were reviewed by me and considered in my medical decision making (see chart for details).  MDM Rules/Calculators/A&P                          Johnny Ramsey was evaluated in Emergency Department on 03/07/2020 for the symptoms described in the history of present illness. He was evaluated in the context of the global COVID-19 pandemic, which necessitated consideration that the patient might be at risk for infection with the SARS-CoV-2 virus that causes COVID-19. Institutional protocols and algorithms that pertain to the evaluation of patients at risk for COVID-19 are in a state of rapid change based on information released by regulatory bodies including the CDC and federal and state organizations.  These policies and algorithms were followed during the  patient's care in the ED.  Patient presenting for evaluation of pleuritic chest pains after Covid exposure.  His significant other is currently in the ICU with Covid.  He exhibits low-grade fevers in the ED of 100.6 F with improvement after Tylenol.  He is mildly tachypneic during this time but improved on reevaluation.  SPO2 saturations are within normal limits and he exhibits no increased work of breathing.  He has a history of bilateral submassive PE and is not currently anticoagulated due to financial constraints.  He was also complaining of bilateral calf pains but has no lower extremity edema or pain on examination.  Neurovascularly intact.  Compartments are soft.  No concern for cellulitis.  Bilateral DVT study obtained was negative.  Covid test today is positive.  With history of PE and complaint of pleuritic chest pain similar to prior PE underwent a CT of the chest which shows no evidence of PE today but does show small patchy airspace opacity involving the left lower lung base consistent with Covid pneumonia.    Lab work reviewed and interpreted by myself shows no leukocytosis, no anemia, no metabolic derangements.  Troponin is negative and EKG shows normal sinus rhythm with no acute ischemic abnormalities.  Doubt ACS/MI, dissection, cardiac tamponade, esophageal rupture, or pneumothorax.  Patient was ambulated in the ED with stable SPO2 saturations.  On reevaluation he is resting comfortably in no distress.  Discussed symptomatic management.  [facility].  He may be a candidate for the infusion clinic as well.  Discussed strict ED return precautions.  Patient verbalized understanding of and agreement with plan and patient is stable for discharge at this time.   Final Clinical Impression(s) / ED Diagnoses Final diagnoses:  COVID-19 virus infection  Pneumonia due to COVID-19 virus    Rx /  DC Orders ED Discharge Orders         Ordered    acetaminophen (TYLENOL) 500 MG tablet  Every 6 hours PRN        03/07/20 2325           Jeanie SewerFawze, Carmon Sahli A, PA-C 03/07/20 2358    Terrilee FilesButler, Michael C, MD 03/08/20 1039

## 2020-03-07 NOTE — ED Triage Notes (Signed)
Patients reports R calf back, CP, back pain X few days. Patient reports history of PE and has been off Xarelto X several months. Also has had recent COVID exposure

## 2020-03-07 NOTE — ED Notes (Signed)
Verbalized understanding of DC instructions, Rx, follow up care with COVID clinic

## 2020-03-08 ENCOUNTER — Telehealth: Payer: Self-pay | Admitting: Family

## 2020-03-08 ENCOUNTER — Telehealth: Payer: Self-pay | Admitting: Adult Health

## 2020-03-08 NOTE — Telephone Encounter (Signed)
Called to Discuss with patient about Covid symptoms and the use of the monoclonal antibody infusion for those with mild to moderate Covid symptoms and at a high risk of hospitalization.     Pt appears to qualify for this infusion due to co-morbid conditions and/or a member of an at-risk group in accordance with the FDA Emergency Use Authorization.   Johnny Ramsey was seen in the ED on 8/31 with pleuritic chest pain that has since resolved and is currently without other symptoms. Risk factors include BMI >25.   Currently without symptoms does not meet EUA criteria. Hotline information provided if symptoms were to develop.   Johnny Eke, NP 03/08/2020 11:31 AM

## 2020-03-08 NOTE — Telephone Encounter (Signed)
Called and LMOM regarding monoclonal antibody treatment for COVID 19 given to those who are at risk for complications and/or hospitalization of the virus.  Patient meets criteria based on: heart issues, bmi greater than 25, h/o blood clots  Call back number given: [phone]  My chart message: sent  Lillard Anes, NP

## 2020-03-09 ENCOUNTER — Telehealth: Payer: Self-pay | Admitting: General Practice

## 2020-03-09 NOTE — Telephone Encounter (Signed)
Called to schedule appointment for patient per referral for Post Covid Care Clinic. Unable to reach patient. Left voicemail for patient to return call.  ° °

## 2020-03-09 NOTE — Telephone Encounter (Signed)
Pt declined to make appt w/ [facility]

## 2020-12-29 IMAGING — CT CT ANGIO CHEST
2 of 6 series · 18 of 46 positions shown · IV contrast (omnipaque)
Comparison: CT chest September 14, 2018

CLINICAL DATA: P several months ago, COVID positive

EXAM:
CT ANGIOGRAPHY CHEST WITH CONTRAST
TECHNIQUE: Multidetector CT imaging of the chest was performed using the
standard protocol during bolus administration of intravenous
contrast. Multiplanar CT image reconstructions and MIPs were
obtained to evaluate the vascular anatomy.
CONTRAST:  80mL OMNIPAQUE IOHEXOL 350 MG/ML SOLN

[Series 6: thins · axial · 0.78mm/px · z∈[+1063,+1299]mm · 15 of 260 slices shown]
[im 12/260  lung]
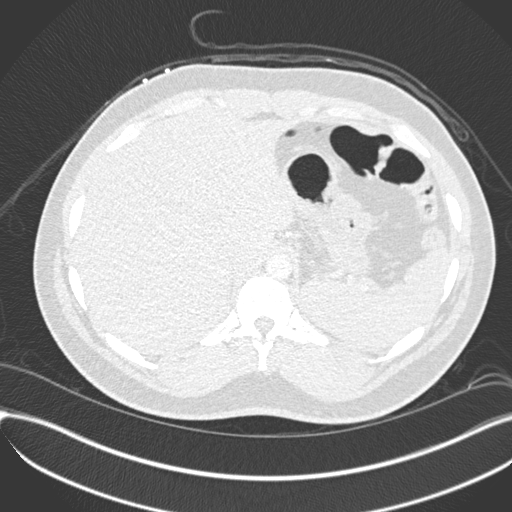
[im 34/260  soft-tissue]
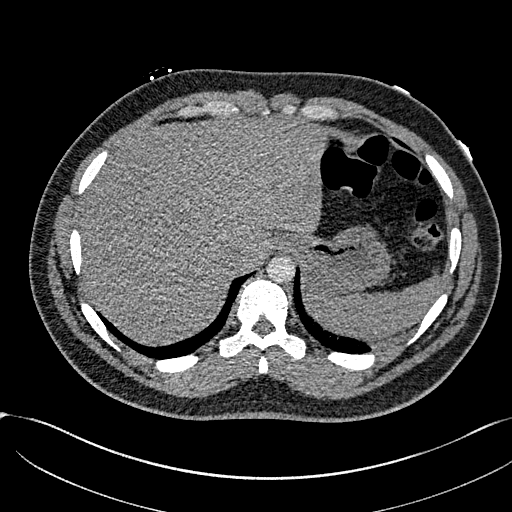
[im 46/260  lung]
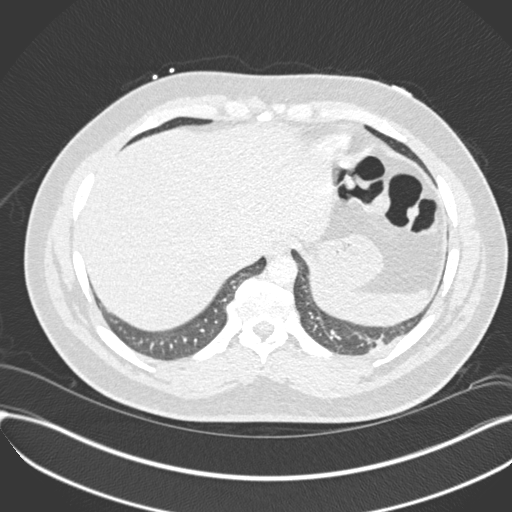
[im 68/260  soft-tissue]
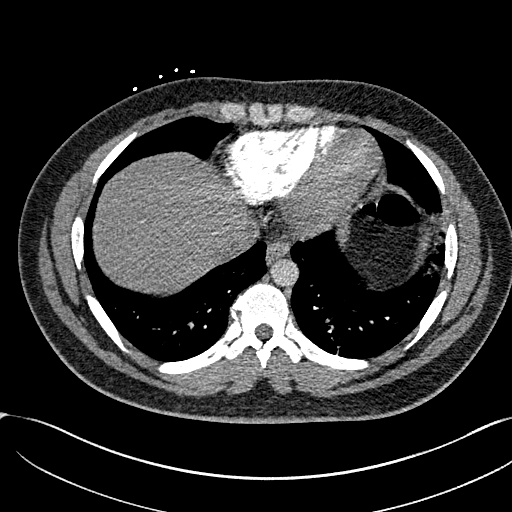
[im 79/260  lung]
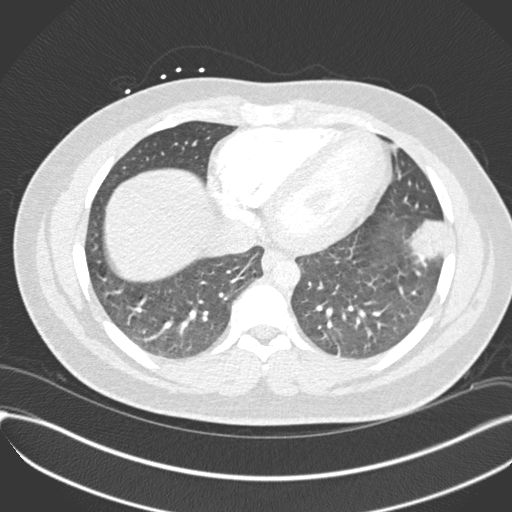
[im 102/260  soft-tissue]
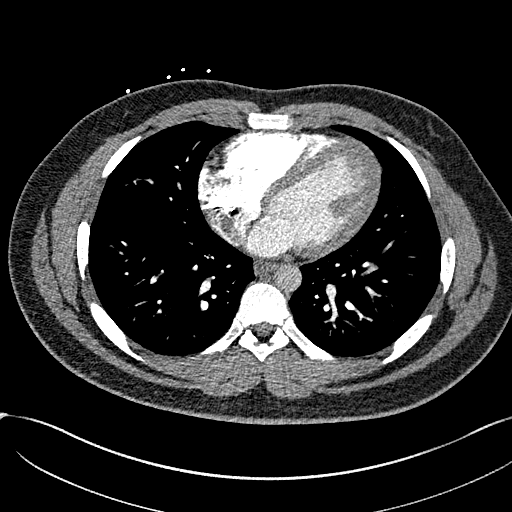
[im 113/260  lung]
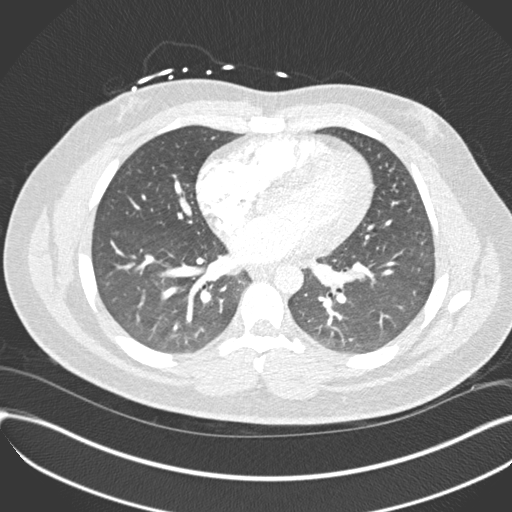
[im 136/260  soft-tissue]
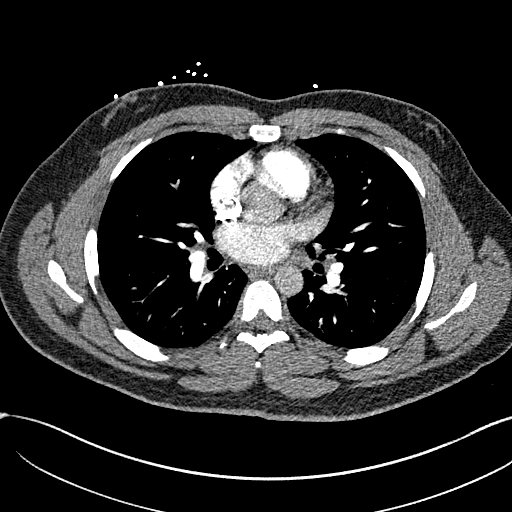
[im 147/260  lung]
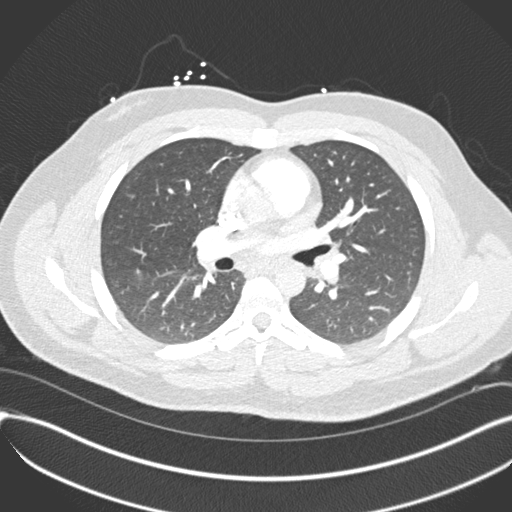
[im 158/260  soft-tissue]
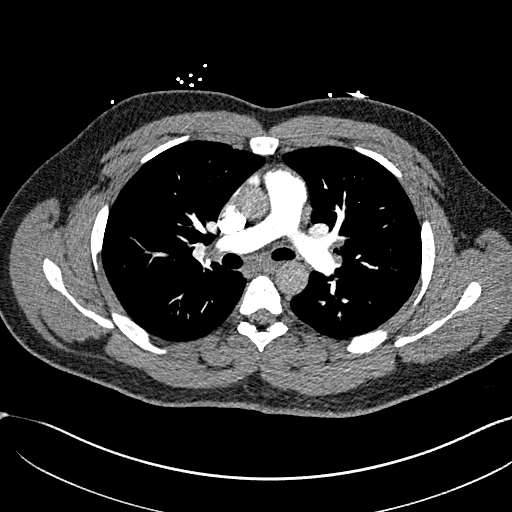
[im 181/260  lung]
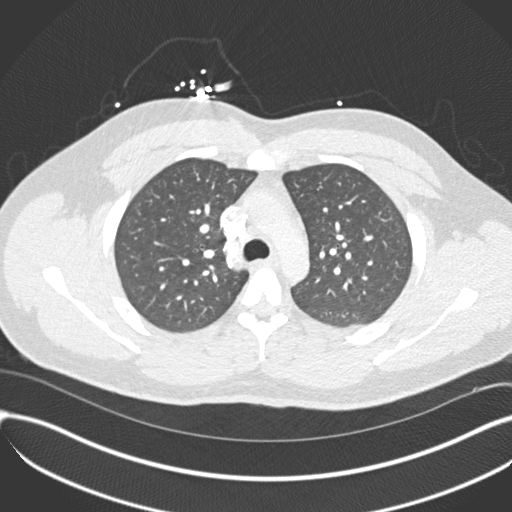
[im 192/260  soft-tissue]
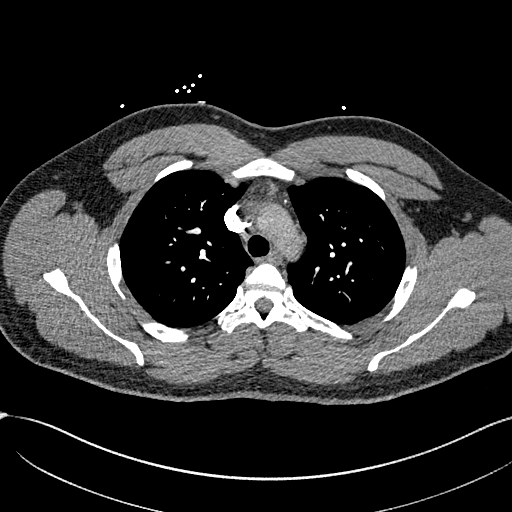
[im 214/260  lung]
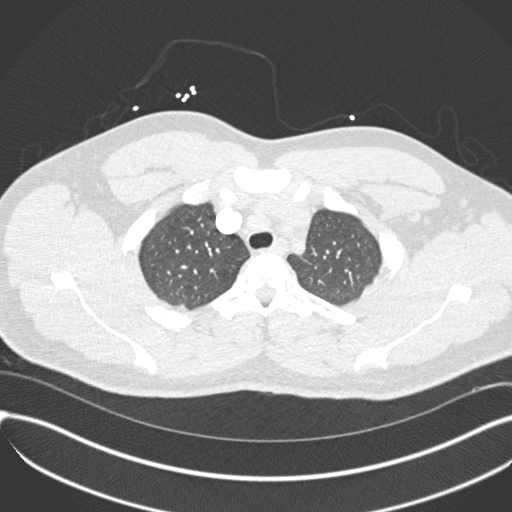
[im 226/260  soft-tissue]
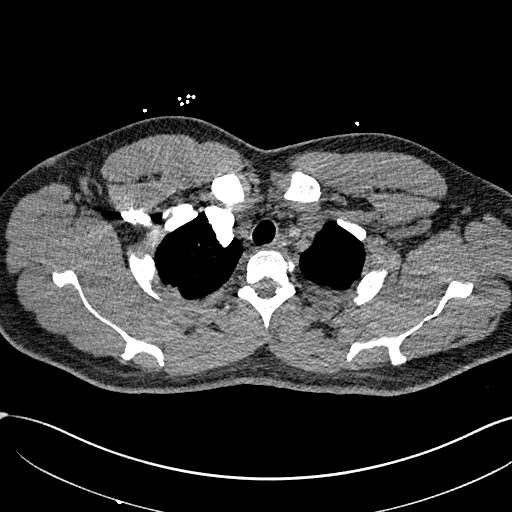
[im 248/260  lung]
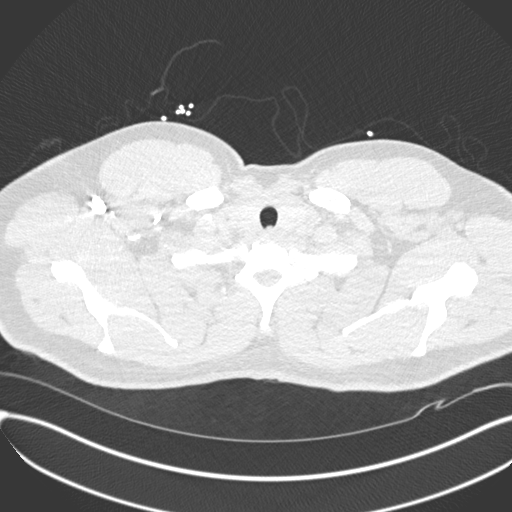

[Series 8: coronal mpr · coronal · 0.51mm/px · 3 of 151 slices shown]
[im 38/151  soft-tissue]
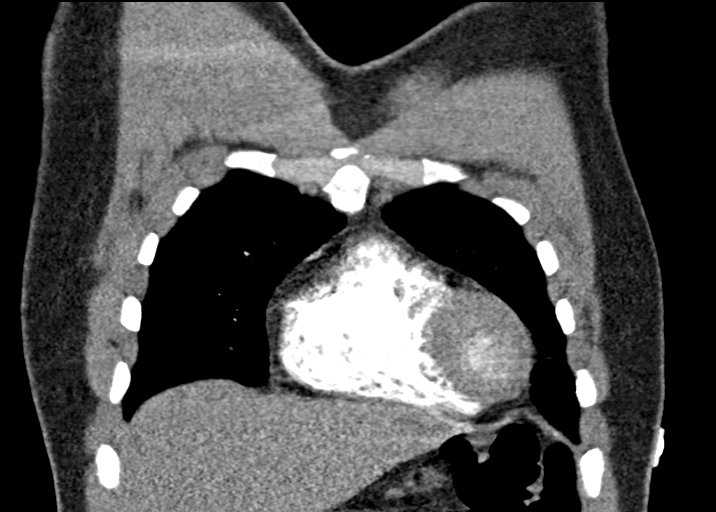
[im 76/151  soft-tissue]
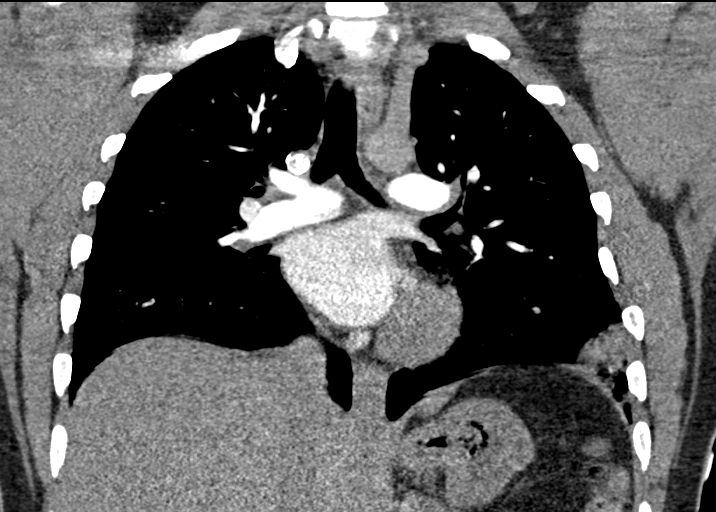
[im 113/151  soft-tissue]
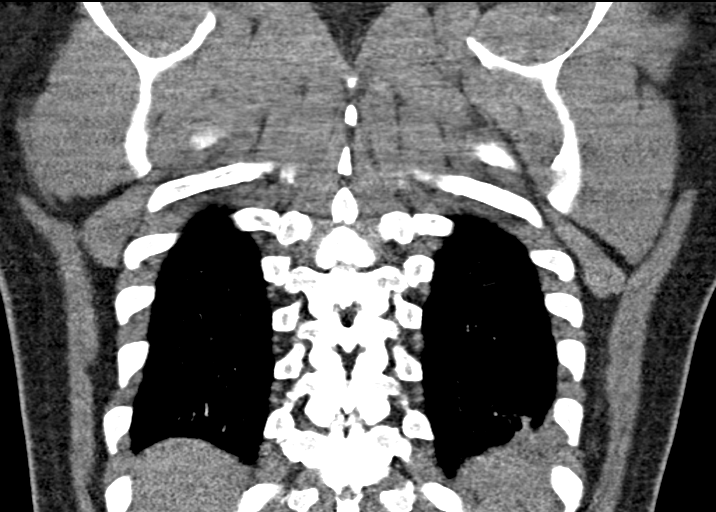

[18 of 46 positions shown; findings below may reference images not displayed]

FINDINGS: Cardiovascular: There is a optimal opacification of the pulmonary
arteries. There is no central,segmental, or subsegmental filling
defects within the pulmonary arteries. The heart is normal in size.
No pericardial effusion or thickening. No evidence right heart
strain. There is normal three-vessel brachiocephalic anatomy without
proximal stenosis. The thoracic aorta is normal in appearance.

Mediastinum/Nodes: No hilar, mediastinal, or axillary adenopathy.
Thyroid gland, trachea, and esophagus demonstrate no significant
findings.

Lungs/Pleura: Rounded patchy airspace consolidation seen at the
anterior left lower lobe. There is also a small amount of subpleural
patchy airspace opacity posteriorly at the left lung base. No
pleural effusion or pneumothorax.

Upper Abdomen: No acute abnormalities present in the visualized
portions of the upper abdomen.

Musculoskeletal: No chest wall abnormality. No acute or significant
osseous findings.

Review of the MIP images confirms the above findings.
IMPRESSION: No central, segmental, or subsegmental pulmonary embolism

Rounded patchy airspace consolidation at the left lower lung, likely
consistent with atypical viral pneumonia.

## 2022-09-01 ENCOUNTER — Emergency Department (HOSPITAL_COMMUNITY)
Admission: EM | Admit: 2022-09-01 | Discharge: 2022-09-01 | Disposition: A | Discharge status: Home / Self Care | Payer: Self-pay | Attending: Emergency Medicine | Admitting: Emergency Medicine

## 2022-09-01 ENCOUNTER — Other Ambulatory Visit: Payer: Self-pay

## 2022-09-01 ENCOUNTER — Emergency Department (HOSPITAL_COMMUNITY): Payer: Self-pay

## 2022-09-01 ENCOUNTER — Encounter (HOSPITAL_COMMUNITY): Payer: Self-pay | Admitting: Emergency Medicine

## 2022-09-01 DIAGNOSIS — Z20822 Contact with and (suspected) exposure to covid-19: Secondary | ICD-10-CM | POA: Insufficient documentation

## 2022-09-01 DIAGNOSIS — Z7901 Long term (current) use of anticoagulants: Secondary | ICD-10-CM | POA: Insufficient documentation

## 2022-09-01 DIAGNOSIS — J069 Acute upper respiratory infection, unspecified: Secondary | ICD-10-CM | POA: Insufficient documentation

## 2022-09-01 LAB — BASIC METABOLIC PANEL
Anion gap: 10 (ref 5–15)
BUN: 8 mg/dL (ref 6–20)
CO2: 23 mmol/L (ref 22–32)
Calcium: 9.2 mg/dL (ref 8.9–10.3)
Chloride: 105 mmol/L (ref 98–111)
Creatinine, Ser: 1.26 mg/dL — ABNORMAL HIGH (ref 0.61–1.24)
GFR, Estimated: >60 mL/min (ref >60)
Glucose, Bld: 125 mg/dL — ABNORMAL HIGH (ref 70–99)
Potassium: 4.1 mmol/L (ref 3.5–5.1)
Sodium: 138 mmol/L (ref 135–145)

## 2022-09-01 LAB — CBC WITH DIFFERENTIAL/PLATELET
Abs Immature Granulocytes: 0.01 10*3/uL (ref 0.00–0.07)
Basophils Absolute: 0.1 10*3/uL (ref 0.0–0.1)
Basophils Relative: 1 %
Eosinophils Absolute: 0.3 10*3/uL (ref 0.0–0.5)
Eosinophils Relative: 4 %
HCT: 43.7 % (ref 39.0–52.0)
Hemoglobin: 14.7 g/dL (ref 13.0–17.0)
Immature Granulocytes: 0 %
Lymphocytes Relative: 34 %
Lymphs Abs: 2.7 10*3/uL (ref 0.7–4.0)
MCH: 27.1 pg (ref 26.0–34.0)
MCHC: 33.6 g/dL (ref 30.0–36.0)
MCV: 80.6 fL (ref 80.0–100.0)
Monocytes Absolute: 0.6 10*3/uL (ref 0.1–1.0)
Monocytes Relative: 7 %
Neutro Abs: 4.4 10*3/uL (ref 1.7–7.7)
Neutrophils Relative %: 54 %
Platelets: 272 10*3/uL (ref 150–400)
RBC: 5.42 MIL/uL (ref 4.22–5.81)
RDW: 14 % (ref 11.5–15.5)
WBC: 8 10*3/uL (ref 4.0–10.5)
nRBC: 0 % (ref 0.0–0.2)

## 2022-09-01 LAB — GROUP A STREP BY PCR: Group A Strep by PCR: NOT DETECTED

## 2022-09-01 LAB — RESP PANEL BY RT-PCR (RSV, FLU A&B, COVID)  RVPGX2
Influenza A by PCR: NEGATIVE
Influenza B by PCR: NEGATIVE
Resp Syncytial Virus by PCR: NEGATIVE
SARS Coronavirus 2 by RT PCR: NEGATIVE

## 2022-09-01 MED ORDER — IOHEXOL 350 MG/ML SOLN
75.0000 mL | Freq: Once | INTRAVENOUS | Status: AC | PRN
  Administered 2022-09-01: 75 mL via INTRAVENOUS

## 2022-09-01 MED ORDER — DEXAMETHASONE SODIUM PHOSPHATE 10 MG/ML IJ SOLN
10.0000 mg | Freq: Once | INTRAMUSCULAR | Status: AC
  Administered 2022-09-01: 10 mg via INTRAVENOUS

## 2022-09-01 MED ORDER — DEXAMETHASONE SODIUM PHOSPHATE 10 MG/ML IJ SOLN
10.0000 mg | Freq: Once | INTRAMUSCULAR | Status: DC
  Filled 2022-09-01: qty 1

## 2022-09-01 NOTE — Care Management (Signed)
Added PCP to AVS

## 2022-09-01 NOTE — ED Provider Notes (Signed)
[facility] Provider Note   CSN: [id number] Arrival date & time: 09/01/22  [id number]     History  Chief Complaint  Patient presents with   Oral Swelling    Johnny Ramsey is a 32 y.o. 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  He denies recent fever, chills, cough, congestion, or other recent illness.  He denies difficulty swallowing, chest pain, shortness of breath, or sore throat. No nausea or vomiting. He denies history of recurrent strep/other pharyngitis.  He denies any new medications, new foods, or other new exposures. No known sick contacts.       Home Medications Prior to Admission medications   Medication Sig Start Date End Date Taking? Authorizing Provider  acetaminophen (TYLENOL) 500 MG tablet Take 1 tablet (500 mg total) by mouth every 6 (six) hours as needed. 03/07/20   Fawze, Mina A, PA-C  methocarbamol (ROBAXIN) 500 MG tablet Take 1 tablet (500 mg total) by mouth every 8 (eight) hours as needed for muscle spasms. 01/25/19   Burchette, Alinda Sierras, MD  predniSONE (DELTASONE) 20 MG tablet Take 1 tablet (20 mg total) by mouth daily with breakfast. 09/18/18   Vasireddy, Grier Mitts, MD  XARELTO 20 MG TABS tablet TAKE 1 TABLET (20 MG TOTAL) BY MOUTH DAILY WITH SUPPER. Patient not taking: Reported on 03/07/2020 01/27/19   Isaac Bliss, Rayford Halsted, MD  XARELTO 20 MG TABS tablet Take 1 tablet (20 mg total) by mouth daily with supper. 12/29/19   Isaac Bliss, Rayford Halsted, MD      Allergies    Patient has no known allergies.    Review of Systems   Review of Systems  All other systems reviewed and are negative.   Physical Exam Updated Vital Signs BP 123/85   Pulse 89   Temp 98.5 F (36.9 C) (Oral)   Resp 16   Ht 6' (1.829 m)   Wt 111.1 kg   SpO2 94%   BMI 33.22 kg/m  Physical Exam Vitals and nursing  note reviewed.  Constitutional:      General: He is not in acute distress.    Appearance: Normal appearance.  HENT:     Head: Normocephalic and atraumatic.     Jaw: There is normal jaw occlusion. No trismus, tenderness, swelling, pain on movement or malocclusion.     Salivary Glands: Right salivary gland is not diffusely enlarged or tender. Left salivary gland is not diffusely enlarged or tender.     Nose: Nose normal.     Right Sinus: No maxillary sinus tenderness or frontal sinus tenderness.     Left Sinus: No maxillary sinus tenderness or frontal sinus tenderness.     Mouth/Throat:     Lips: Pink. No lesions.     Mouth: Mucous membranes are moist. No oral lesions or angioedema.     Dentition: Normal dentition. No dental tenderness, gingival swelling, dental caries, dental abscesses or gum lesions.     Tongue: No lesions. Tongue does not deviate from midline.     Palate: No mass and lesions.     Pharynx: Uvula swelling (slight to right uvula causing slight deviation but no erythema or exudates) present. No posterior oropharyngeal erythema.     Tonsils: No tonsillar exudate or tonsillar abscesses. 1+ on the right. 1+ on the left.     Comments: Patient tolerating  secretions and speaking in full sentences with ease; no submandibular, sublingual, or submental tenderness, induration, fluctuance; no signs of peritonsillar abscess; patent airway Eyes:     Conjunctiva/sclera: Conjunctivae normal.  Cardiovascular:     Rate and Rhythm: Normal rate and regular rhythm.     Heart sounds: No murmur heard. Pulmonary:     Effort: Pulmonary effort is normal. No respiratory distress.     Breath sounds: Normal breath sounds. No stridor. No wheezing, rhonchi or rales.  Abdominal:     General: Abdomen is flat.     Palpations: Abdomen is soft.  Musculoskeletal:        General: Normal range of motion.     Cervical back: Normal range of motion and neck supple. No rigidity.     Right lower leg: No edema.      Left lower leg: No edema.  Lymphadenopathy:     Cervical: No cervical adenopathy.  Skin:    General: Skin is warm and dry.     Capillary Refill: Capillary refill takes less than 2 seconds.  Neurological:     Mental Status: He is alert. Mental status is at baseline.  Psychiatric:        Behavior: Behavior normal.     ED Results / Procedures / Treatments   Labs (all labs ordered are listed, but only abnormal results are displayed) Labs Reviewed  BASIC METABOLIC PANEL - Abnormal; Notable for the following components:      Result Value   Glucose, Bld 125 (*)    Creatinine, Ser 1.26 (*)    All other components within normal limits  GROUP A STREP BY PCR  RESP PANEL BY RT-PCR (RSV, FLU A&B, COVID)  RVPGX2  CBC WITH DIFFERENTIAL/PLATELET    EKG None  Radiology CT Soft Tissue Neck W Contrast  Result Date: 09/01/2022 CLINICAL DATA:  32 year old male with throat swelling, feels like throat is closing up. EXAM: CT NECK WITH CONTRAST TECHNIQUE: Multidetector CT imaging of the neck was performed using the standard protocol following the bolus administration of intravenous contrast. RADIATION DOSE REDUCTION: This exam was performed according to the departmental dose-optimization program which includes automated exposure control, adjustment of the mA and/or kV according to patient size and/or use of iterative reconstruction technique. CONTRAST:  65m OMNIPAQUE IOHEXOL 350 MG/ML SOLN COMPARISON:  CTA chest 03/07/2020. FINDINGS: Pharynx and larynx: Normal epiglottis (sagittal image 44). Hypopharynx appears normal. The glottis is closed with mild motion artifact, but the larynx is within normal limits. There is bilateral adenoid and palatine tonsil hypertrophy which is mildly asymmetric. Lingual tonsil less affected. No tonsillar hyperenhancement. No parapharyngeal or retropharyngeal space inflammation. No suppurative changes. Salivary glands: Negative.  Negative sublingual space. Thyroid: Within  normal limits, thoracic inlet streak artifact. Lymph nodes: Cervical lymph nodes remain within normal limits, fairly symmetric. Vascular: Major vascular structures in the neck and at the skull base appear patent including both internal jugular veins. Limited intracranial: Negative. Visualized orbits: Negative. Mastoids and visualized paranasal sinuses: Mild mostly ethmoid and frontal sinus mucosal thickening. No sinus fluid levels identified. Tympanic cavities and visible mastoids remain clear. Skeleton: Carious wisdom teeth on the right side. No other acute dental finding. No acute osseous abnormality identified. Upper chest: Negative. IMPRESSION: 1. Larynx and epiglottis appear normal. Symmetric adenoid and palatine tonsil hypertrophy compatible with URI or mild Tonsillitis. Mild paranasal sinus inflammation. No significant lymphadenopathy, abscess or other complicating features. 2. Carious right side wisdom teeth. Electronically Signed   By: HHerminio HeadsD.  On: 09/01/2022 10:20    Procedures Procedures    Medications Ordered in ED Medications  dexamethasone (DECADRON) injection 10 mg (10 mg Intravenous Given 09/01/22 0730)  iohexol (OMNIPAQUE) 350 MG/ML injection 75 mL (75 mLs Intravenous Contrast Given 09/01/22 0957)    ED Course/ Medical Decision Making/ A&P                             Medical Decision Making Amount and/or Complexity of Data Reviewed Labs: ordered. Decision-making details documented in ED Course. Radiology: ordered. Decision-making details documented in ED Course.  Risk Prescription drug management.   Medical Decision Making:   Johnny Ramsey is a 32 y.o. male who presented to the ED today with uvula swelling detailed above.     Complete initial physical exam performed, notably the patient was in no acute respiratory distress, tolerating secretions and speaking in full sentences, with patent airway but does have slight uvula swelling on the right but otherwise  oropharyngeal exam is unremarkable.  Lungs clear to auscultation. No stridor, tripoding, or other signs of acute or respiratory distress. Reviewed and confirmed nursing documentation for past medical history, family history, social history.    Initial Assessment:   With the patient's presentation of uvula swelling, most likely diagnosis is URI. Other diagnoses were considered including (but not limited to) strep pharyngitis, viral pharyngitis, peritonsillar abscess, retropharyngeal abscess, Ludwig's angina, epiglottitis, tonsillitis. These are considered less likely due to history of present illness and physical exam findings.   This is most consistent with an acute complicated illness  Re-examinations: 0850 -- Patient sleeping comfortably on re-examination. Protecting airway. No respiratory distress. Awaiting CT scan.   1045 -patient updated on all findings and discharge plan.  Patient remains resting comfortably in no acute distress.  Patient expressed understanding and agreement with plan.  Initial Plan:  Screening labs including CBC and Metabolic panel to evaluate for infectious or metabolic etiology of disease.  Strep swab to evaluate for acute bacterial pharyngitis CT soft tissue neck to evaluate for peritonsillar or retropharyngeal abscess, epiglottitis, or other significant abnormalities Viral swabs to evaluate for COVID, RSV, influenza Dose of dexamethasone Objective evaluation as reviewed   Initial Study Results:   Laboratory  All laboratory results reviewed without evidence of clinically relevant pathology.   Exceptions include: Cr 1.26   Radiology:  All images reviewed independently. Agree with radiology report at this time.   CT Soft Tissue Neck W Contrast  Result Date: 09/01/2022 CLINICAL DATA:  32 year old male with throat swelling, feels like throat is closing up. EXAM: CT NECK WITH CONTRAST TECHNIQUE: Multidetector CT imaging of the neck was performed using the standard  protocol following the bolus administration of intravenous contrast. RADIATION DOSE REDUCTION: This exam was performed according to the departmental dose-optimization program which includes automated exposure control, adjustment of the mA and/or kV according to patient size and/or use of iterative reconstruction technique. CONTRAST:  87m OMNIPAQUE IOHEXOL 350 MG/ML SOLN COMPARISON:  CTA chest 03/07/2020. FINDINGS: Pharynx and larynx: Normal epiglottis (sagittal image 44). Hypopharynx appears normal. The glottis is closed with mild motion artifact, but the larynx is within normal limits. There is bilateral adenoid and palatine tonsil hypertrophy which is mildly asymmetric. Lingual tonsil less affected. No tonsillar hyperenhancement. No parapharyngeal or retropharyngeal space inflammation. No suppurative changes. Salivary glands: Negative.  Negative sublingual space. Thyroid: Within normal limits, thoracic inlet streak artifact. Lymph nodes: Cervical lymph nodes remain within normal limits, fairly symmetric.  Vascular: Major vascular structures in the neck and at the skull base appear patent including both internal jugular veins. Limited intracranial: Negative. Visualized orbits: Negative. Mastoids and visualized paranasal sinuses: Mild mostly ethmoid and frontal sinus mucosal thickening. No sinus fluid levels identified. Tympanic cavities and visible mastoids remain clear. Skeleton: Carious wisdom teeth on the right side. No other acute dental finding. No acute osseous abnormality identified. Upper chest: Negative. IMPRESSION: 1. Larynx and epiglottis appear normal. Symmetric adenoid and palatine tonsil hypertrophy compatible with URI or mild Tonsillitis. Mild paranasal sinus inflammation. No significant lymphadenopathy, abscess or other complicating features. 2. Carious right side wisdom teeth. Electronically Signed   By: Genevie Ann M.D.   On: 09/01/2022 10:20      Final Assessment and Plan:   This is a 32 year old  male presenting to the ED with complaint of uvula swelling that onset this morning.  He has had no other associated symptoms including recent cough, congestion, fever and has no difficulty swallowing or difficulty breathing.  On exam, patient is in no acute distress with normal oxygen saturation on room air, normal respirations, lungs are clear to auscultation, and he has no stridor or muffled voice.  He does appear to have slight swelling to the right side of the uvula that may be slightly deviating it to the left but overall his airway is patent and the remainder of his oropharyngeal exam is unremarkable.  I do not see any evidence of peritonsillar or retropharyngeal abscess or Ludwig's angina.  He is tolerating secretions and speaking in full sentences with ease.  Dose of dexamethasone given while awaiting workup.  Strep swab negative.  Viral swabs for COVID, RSV, and influenza negative.  Without good explanation for patient's symptoms, CT neck with contrast obtained for further evaluation.  Blood work without leukocytosis or other significant abnormality.  CT shows no signs of epiglottitis, abscess, or significant lymphadenopathy.  Slight tonsil hypertrophy most consistent with viral upper respiratory infection.  Very low suspicion for bacterial infection.  Patient has remained afebrile throughout ED stay and with otherwise stable vital signs.  On multiple reexaminations, patient has remained in no acute distress, protecting airway, and with no respiratory symptoms tolerating secretions with ease.  Discussed all findings with patient as well as discharge plan.  Counseled patient on cessation of vaping.  Patient expressed understanding of this plan.  Provided patient with outpatient primary care follow-up as needed.  Patient given strict ED return precautions, all questions answered, and patient stable for discharge.   Clinical Impression:  1. URI, acute      Discharge           Final Clinical  Impression(s) / ED Diagnoses Final diagnoses:  URI, acute    Rx / DC Orders ED Discharge Orders     None         Suzzette Righter, PA-C 09/01/22 1107    Dorie Rank, MD 09/02/22 [phone]

## 2022-09-01 NOTE — Discharge Instructions (Addendum)
Thank you for letting us take care of you today.  Your blood work was reassuring we do not see any signs of a bacterial infection.  Your strep swab and swabs for COVID, RSV, and flu were all negative.  The CT scan that we did did not show any severe cause of your symptoms such as an abscess or problem with your airway.  We gave you a dose of steroids to help with your symptoms. With reviewing the workup, I believe that your symptoms are likely due to a viral illness.  Please see instructions below on managing symptoms at home.  Viral Illness TREATMENT  Treatment is directed at relieving symptoms. There is no cure. Antibiotics are not effective because the infection is caused by a virus not by bacteria. Treatment may include:  Increased fluid intake. Sports drinks offer valuable electrolytes, sugars, and fluids.  Breathing heated mist or steam (vaporizer or shower).  Eating chicken soup or other clear broths, and maintaining good nutrition.  Getting plenty of rest.  Using gargles or lozenges for comfort.  Increasing usage of your inhaler if you have asthma.  Return to work when your temperature has returned to normal.  Gargle warm salt water and spit it out for sore throat. Take benadryl to decrease sinus secretions. Continue to alternate between Tylenol and ibuprofen for pain and fever control. Try to cut back on vaping.  Follow Up: Follow up with your primary care doctor in 5-7 days for recheck of ongoing symptoms.  If you do not have a primary care provider, I provided the names of 2 primary care clinics you may contact to arrange a follow-up appointment as needed.  Return to emergency department for emergent changing or worsening of symptoms or new symptoms such as difficulty swallowing or difficulty breathing.

## 2022-09-01 NOTE — ED Triage Notes (Signed)
Pt from home reports that he woke up about an hour ago feeling like his throat is "closing up."  Pt is able to speak in complete sentences, does not appear to be in respiratory distress however his throat does appear swollen.  Pt reports no change in severity since waking up.  Denies any other symptoms including fever/chills or cough.

## 2024-02-03 ENCOUNTER — Other Ambulatory Visit: Payer: Self-pay

## 2024-02-03 ENCOUNTER — Emergency Department (HOSPITAL_COMMUNITY)
Admission: EM | Admit: 2024-02-03 | Discharge: 2024-02-04 | Discharge status: Against Medical Advice | Payer: Self-pay | Attending: Emergency Medicine | Admitting: Emergency Medicine

## 2024-02-03 DIAGNOSIS — H5712 Ocular pain, left eye: Secondary | ICD-10-CM | POA: Insufficient documentation

## 2024-02-03 DIAGNOSIS — Z5321 Procedure and treatment not carried out due to patient leaving prior to being seen by health care provider: Secondary | ICD-10-CM | POA: Insufficient documentation

## 2024-02-03 DIAGNOSIS — H5789 Other specified disorders of eye and adnexa: Secondary | ICD-10-CM | POA: Insufficient documentation

## 2024-02-03 NOTE — ED Triage Notes (Signed)
 Pt reports burning/redness in left eye x3 days. Clear drainage starting today. No changes/decrease in vision. Denies fever.

## 2024-02-12 ENCOUNTER — Emergency Department (HOSPITAL_COMMUNITY)
Admission: EM | Admit: 2024-02-12 | Discharge: 2024-02-12 | Disposition: A | Discharge status: Home / Self Care | Payer: Self-pay | Attending: Emergency Medicine | Admitting: Emergency Medicine

## 2024-02-12 ENCOUNTER — Encounter (HOSPITAL_COMMUNITY): Payer: Self-pay

## 2024-02-12 ENCOUNTER — Other Ambulatory Visit: Payer: Self-pay

## 2024-02-12 DIAGNOSIS — H11432 Conjunctival hyperemia, left eye: Secondary | ICD-10-CM | POA: Insufficient documentation

## 2024-02-12 DIAGNOSIS — H109 Unspecified conjunctivitis: Secondary | ICD-10-CM

## 2024-02-12 MED ORDER — FLUORESCEIN SODIUM 1 MG OP STRP
1.0000 | ORAL_STRIP | Freq: Once | OPHTHALMIC | Status: AC
  Administered 2024-02-12: 1 via OPHTHALMIC
  Filled 2024-02-12: qty 1

## 2024-02-12 MED ORDER — ERYTHROMYCIN 5 MG/GM OP OINT: TOPICAL_OINTMENT | OPHTHALMIC | 0 refills | Status: AC

## 2024-02-12 MED ORDER — TETRACAINE HCL 0.5 % OP SOLN
2.0000 [drp] | Freq: Once | OPHTHALMIC | Status: AC
  Administered 2024-02-12: 2 [drp] via OPHTHALMIC
  Filled 2024-02-12: qty 4

## 2024-02-12 MED ORDER — ERYTHROMYCIN 5 MG/GM OP OINT
TOPICAL_OINTMENT | Freq: Once | OPHTHALMIC | Status: AC
  Filled 2024-02-12: qty 3.5

## 2024-02-12 NOTE — Discharge Instructions (Signed)
 Today you were seen for eye irritation.  You have been prescribed an antibiotic, please take as prescribed.  Please follow-up with ophthalmology if your symptoms persist for further evaluation and workup.  Please return to the ED if you have severe eye pain, double vision, or uncontrollable vomiting.  Thank you for letting us  treat you today. After performing a physical exam, I feel you are safe to go home. Please follow up with your PCP in the next several days and provide them with your records from this visit. Return to the Emergency Room if pain becomes severe or symptoms worsen.

## 2024-02-12 NOTE — ED Notes (Signed)
 20/25 Right eye, 20/30 left eye for visual acuity.

## 2024-02-12 NOTE — ED Provider Notes (Signed)
 [facility] EMERGENCY DEPARTMENT AT [facility] Provider Note   CSN: [id number] Arrival date & time: 02/12/24  1827     Patient presents with: Eye Irritation   Johnny Ramsey is a 33 y.o. male presents today for left eye irritation for 1 week.  Patient states he used eyedrops which provided relief and then it became red again.  Patient does recall getting any foreign bodies in his eye.  Patient denies pain, discharge, fever, chills, nausea, vomiting, congestion, any other complaints at this time.  Patient denies contact use.   HPI     Prior to Admission medications   Medication Sig Start Date End Date Taking? Authorizing Provider  erythromycin  ophthalmic ointment Place a 1/2 inch ribbon of ointment into the lower eyelid up to six times daily until symptoms resolve 02/12/24  Yes Bert Ptacek N, PA-C  acetaminophen  (TYLENOL ) 500 MG tablet Take 1 tablet (500 mg total) by mouth every 6 (six) hours as needed. 03/07/20   Fawze, Mina A, PA-C  methocarbamol  (ROBAXIN ) 500 MG tablet Take 1 tablet (500 mg total) by mouth every 8 (eight) hours as needed for muscle spasms. 01/25/19   Burchette, Wolm ORN, MD  predniSONE  (DELTASONE ) 20 MG tablet Take 1 tablet (20 mg total) by mouth daily with breakfast. 09/18/18   Vasireddy, Victorino, MD  XARELTO  20 MG TABS tablet TAKE 1 TABLET (20 MG TOTAL) BY MOUTH DAILY WITH SUPPER. Patient not taking: Reported on 03/07/2020 01/27/19   Theophilus Andrews, Tully GRADE, MD  XARELTO  20 MG TABS tablet Take 1 tablet (20 mg total) by mouth daily with supper. 12/29/19   Theophilus Andrews, Tully GRADE, MD    Allergies: Patient has no known allergies.    Review of Systems  Eyes:  Positive for redness.    Updated Vital Signs BP (!) 164/102   Pulse 79   Temp 98.3 F (36.8 C) (Oral)   Resp 16   Ht 6' (1.829 m)   Wt 113.4 kg   SpO2 100%   BMI 33.91 kg/m   Physical Exam Vitals and nursing note reviewed.  Constitutional:      General: He is not in acute distress.     Appearance: Normal appearance. He is well-developed. He is not ill-appearing.  HENT:     Head: Normocephalic and atraumatic.  Eyes:     General: Lids are normal. Lids are everted, no foreign bodies appreciated. Vision grossly intact.        Left eye: No foreign body or discharge.     Intraocular pressure: Left eye pressure is 10 mmHg. Measurements were taken using a handheld tonometer.    Extraocular Movements: Extraocular movements intact.     Right eye: Normal extraocular motion and no nystagmus.     Left eye: Normal extraocular motion and no nystagmus.     Conjunctiva/sclera:     Left eye: Left conjunctiva is injected. No chemosis or exudate. Cardiovascular:     Rate and Rhythm: Normal rate and regular rhythm.     Heart sounds: No murmur heard. Pulmonary:     Effort: Pulmonary effort is normal. No respiratory distress.     Breath sounds: Normal breath sounds.  Abdominal:     Palpations: Abdomen is soft.     Tenderness: There is no abdominal tenderness.  Musculoskeletal:        General: No swelling.     Cervical back: Neck supple.  Skin:    General: Skin is warm and dry.  Capillary Refill: Capillary refill takes less than 2 seconds.  Neurological:     Mental Status: He is alert.  Psychiatric:        Mood and Affect: Mood normal.     (all labs ordered are listed, but only abnormal results are displayed) Labs Reviewed - No data to display  EKG: None  Radiology: No results found.   Procedures   Medications Ordered in the ED  erythromycin  ophthalmic ointment (has no administration in time range)  tetracaine  (PONTOCAINE) 0.5 % ophthalmic solution 2 drop (2 drops Left Eye Given 02/12/24 1840)  fluorescein  ophthalmic strip 1 strip (1 strip Left Eye Given 02/12/24 1840)                                    Medical Decision Making Risk Prescription drug management.   This patient presents to the ED for concern of eye redness and cloudy vision differential diagnosis  includes conjunctivitis, foreign body, abrasion   Medicines ordered and prescription drug management:  I ordered medication including erythromycin     I have reviewed the patients home medicines and have made adjustments as needed   Problem List / ED Course:  OS 20/30, OD 20/25 Considered for admission further workup however patient's vital signs and physical exam are reassuring.  Patient given antibiotic for conjunctivitis and advised to follow-up with ophthalmology if his symptoms persist for further evaluation and workup.  Patient given return precautions.  I feel patient is safe for discharge at this time.       Final diagnoses:  Conjunctivitis of left eye, unspecified conjunctivitis type    ED Discharge Orders          Ordered    erythromycin  ophthalmic ointment        02/12/24 1858               Francis Ileana SAILOR, PA-C 02/12/24 1859    Dasie Faden, MD 02/12/24 2106

## 2024-02-12 NOTE — ED Triage Notes (Signed)
 Patient said his left eye has been irritated and cloudy feeling for over 1 week. Attempted eye drops and said it cleared up, then it became red again.
